# Patient Record
Sex: Female | Born: 1960 | Race: White | Hispanic: No | Marital: Married | Smoking: Never smoker
Health system: Southern US, Community
[De-identification: ages and names within clinical notes are randomized; demographics above are authoritative.]

## PROBLEM LIST (undated history)

## (undated) DIAGNOSIS — D6861 Antiphospholipid syndrome: Secondary | ICD-10-CM

## (undated) DIAGNOSIS — G459 Transient cerebral ischemic attack, unspecified: Secondary | ICD-10-CM

## (undated) DIAGNOSIS — M1990 Unspecified osteoarthritis, unspecified site: Secondary | ICD-10-CM

## (undated) DIAGNOSIS — J45909 Unspecified asthma, uncomplicated: Secondary | ICD-10-CM

## (undated) DIAGNOSIS — E7800 Pure hypercholesterolemia, unspecified: Secondary | ICD-10-CM

## (undated) DIAGNOSIS — M5030 Other cervical disc degeneration, unspecified cervical region: Secondary | ICD-10-CM

## (undated) DIAGNOSIS — I10 Essential (primary) hypertension: Secondary | ICD-10-CM

## (undated) HISTORY — PX: TONSILLECTOMY: SHX5217

## (undated) HISTORY — DX: Antiphospholipid syndrome: D68.61

## (undated) HISTORY — DX: Transient cerebral ischemic attack, unspecified: G45.9

## (undated) HISTORY — PX: ABLATION: SHX5711

## (undated) HISTORY — PX: EYE SURGERY: SHX253

## (undated) HISTORY — PX: TUBAL LIGATION: SHX77

## (undated) HISTORY — DX: Other cervical disc degeneration, unspecified cervical region: M50.30

## (undated) HISTORY — DX: Unspecified asthma, uncomplicated: J45.909

## (undated) HISTORY — PX: GALLBLADDER SURGERY: SHX652

## (undated) HISTORY — PX: CATARACT EXTRACTION: SUR2

## (undated) HISTORY — DX: Unspecified osteoarthritis, unspecified site: M19.90

---

## 1998-10-14 ENCOUNTER — Inpatient Hospital Stay (HOSPITAL_COMMUNITY): Admission: AD | Admit: 1998-10-14 | Discharge: 1998-10-17 | Payer: Self-pay | Admitting: Family Medicine

## 2001-12-09 ENCOUNTER — Other Ambulatory Visit: Admission: RE | Admit: 2001-12-09 | Discharge: 2001-12-09 | Payer: Self-pay | Admitting: Obstetrics and Gynecology

## 2003-02-19 ENCOUNTER — Other Ambulatory Visit: Admission: RE | Admit: 2003-02-19 | Discharge: 2003-02-19 | Payer: Self-pay | Admitting: Obstetrics and Gynecology

## 2004-03-25 ENCOUNTER — Other Ambulatory Visit: Admission: RE | Admit: 2004-03-25 | Discharge: 2004-03-25 | Payer: Self-pay | Admitting: Obstetrics and Gynecology

## 2005-08-24 ENCOUNTER — Other Ambulatory Visit: Admission: RE | Admit: 2005-08-24 | Discharge: 2005-08-24 | Payer: Self-pay | Admitting: Obstetrics and Gynecology

## 2006-05-11 ENCOUNTER — Encounter (INDEPENDENT_AMBULATORY_CARE_PROVIDER_SITE_OTHER): Payer: Self-pay | Admitting: Specialist

## 2006-05-11 ENCOUNTER — Ambulatory Visit (HOSPITAL_COMMUNITY): Admission: RE | Admit: 2006-05-11 | Discharge: 2006-05-11 | Payer: Self-pay | Admitting: Obstetrics and Gynecology

## 2009-10-15 ENCOUNTER — Encounter: Payer: Self-pay | Admitting: Internal Medicine

## 2009-11-02 ENCOUNTER — Ambulatory Visit: Payer: Self-pay | Admitting: Internal Medicine

## 2009-11-02 DIAGNOSIS — J40 Bronchitis, not specified as acute or chronic: Secondary | ICD-10-CM | POA: Insufficient documentation

## 2011-04-28 NOTE — Op Note (Signed)
NAME:  Tina Lester, LATKA NO.:  [id number]   MEDICAL RECORD NO.:  [id number]          PATIENT TYPE:  AMB   LOCATION:  SDC                           FACILITY:  [facility]   PHYSICIAN:  Miguel Aschoff, M.D.       DATE OF BIRTH:  30-Jul-1961   DATE OF PROCEDURE:  05/11/2006  DATE OF DISCHARGE:                                 OPERATIVE REPORT   PREOPERATIVE DIAGNOSIS:  Menometrorrhagia.   POSTOPERATIVE DIAGNOSIS:  Menometrorrhagia.   PROCEDURE:  D&C hysteroscopy, Novasure endometrial ablation.   SURGEON:  Dr. Miguel Aschoff.   ANESTHESIA:  General.   COMPLICATIONS:  None.   JUSTIFICATION:  The patient is a 50 year old white female with history of  very heavy and irregular menstrual cycles.  The patient was evaluated in the  office and offered various options including Jearld Adjutant IUD, cyclic progesterone  therapy, birth control pills or D&C and endometrial ablation.  After  undergoing discussion of the risks and benefits of each method, the patient  is elected to undergo D&C hysteroscopy and endometrial ablation.  Risks and  benefits of procedure were discussed with the patient.   PROCEDURE:  The patient is taken to the operating placed in the supine  position.  General anesthesia was administered without difficulty.  She was  then placed in dorsolithotomy position and prepped and draped in the usual  sterile fashion.  Examination was carried out which revealed normal external  genitalia, normal Bartholin and Skene's glands, normal urethra.  The vaginal  vault was without gross lesion.  The cervix was without gross lesion.  Uterus noted be normal size, mid position.  No adnexal masses were palpated.  Speculum was then placed in the vaginal vault.  Anterior cervical lip was  grasped with tenaculum and endocervical canal was sounded to 4 cm.  The  endometrial cavity was sounded to 10 cm for cavity length of 6 cm.  After  this was done, the endometrial cavity was reached by dilating the  cervix  using serial Pratt dilators until a #25 Pratt dilator could be passed.  The  diagnostic hysteroscope was advanced through the endocervix.  No  endocervical lesions were noted.  The endometrial cavity was unremarkable.  No polyps or submucous myomas were noted.  At this point, vigorous sharp  curettage was carried out.  The tissue removed was sent for histologic  study.  At this point the Novasure ablation unit was then placed into the  uterine cavity. A cavity width of 4.2 cm was found and then with a power  setting of 139 watts, a 59-second treatment cycle was carried out without  difficulty.  At this point all instruments were removed.  The Novasure unit  was removed intact.  The paracervical block was then placed for  postoperative pain relief and procedure was completed.  The estimated blood  loss was less than 30 mL.   Plan is for the patient be discharged home.  Medications for home include  doxycycline 100 mg twice a day x3 days, Darvocet N 100 every 4 hours as  needed for pain.  She will be seen back in  four weeks for follow-up  examination.  She is to call for any problems such as fever, pain or heavy  bleeding.  She is instructed to place nothing in the vagina for two weeks.      Miguel Aschoff, M.D.  Electronically Signed     AR/MEDQ  D:  05/11/2006  T:  05/11/2006  Job:  [id number]

## 2013-03-12 ENCOUNTER — Other Ambulatory Visit: Payer: Self-pay | Admitting: Obstetrics and Gynecology

## 2014-03-25 ENCOUNTER — Other Ambulatory Visit: Payer: Self-pay | Admitting: Obstetrics and Gynecology

## 2014-12-11 DIAGNOSIS — G459 Transient cerebral ischemic attack, unspecified: Secondary | ICD-10-CM

## 2014-12-11 HISTORY — DX: Transient cerebral ischemic attack, unspecified: G45.9

## 2015-09-29 ENCOUNTER — Ambulatory Visit (INDEPENDENT_AMBULATORY_CARE_PROVIDER_SITE_OTHER): Payer: 59 | Admitting: Neurology

## 2015-09-29 ENCOUNTER — Encounter: Payer: Self-pay | Admitting: Neurology

## 2015-09-29 VITALS — BP 134/94 | HR 82 | Ht 62.0 in | Wt 198.0 lb

## 2015-09-29 DIAGNOSIS — R200 Anesthesia of skin: Secondary | ICD-10-CM | POA: Diagnosis not present

## 2015-09-29 DIAGNOSIS — R930 Abnormal findings on diagnostic imaging of skull and head, not elsewhere classified: Secondary | ICD-10-CM

## 2015-09-29 DIAGNOSIS — R9089 Other abnormal findings on diagnostic imaging of central nervous system: Secondary | ICD-10-CM

## 2015-09-29 NOTE — Progress Notes (Signed)
PATIENT: Tina Lester DOB: 01/26/1961  Chief Complaint  Patient presents with  . Transient Ischemic Attack    Reports having multiple TIA events in the past. Her most recent event caused her to have left-sided numbness, weakness and dizziness.  She was admitted to [facility] for two days.  Says all her test were negative for stroke with the exception of an abnormal MRI.  She still has slight residual left-sided weakness and was discharged from PT today with instructions to continue home exercises.     HISTORICAL  Tina Lester, is a 54 years old right-handed female, seen in refer by her primary care physician Dr. Philemon Kingdomaroline Prochnau, MD in September 29 2015, for evidence of TIA like episode.  In September 16 2015, while at sleep, she woke up around 3 clock in the morning, felt something moving to her body, her whole left side was numb, including left face, arm and leg, mild slurred speech, she also noticed mild left-sided weakness, numbness last about one hour, weakness last about 5 days.  She presented to [facility] emergency room, had extensive evaluation, I have personally reviewed CAT scan of the brain, mild atrophy small vessel disease, no acute lesions. I have personally reviewed MRI of the brain with without contrast in September 16 2015, there was no acute stroke, mild atrophy and small vessel disease.  Laboratory showed normal CMP, CBC, CPK was 115, normal liver functional test. cholesterol panel cholesterol was 221, triglyceride 162, LDL 137, TSH was normal 1.35 normal INR 1.0 negative RPR, negative ANA,  EKG was normal. Ultrasound of carotid artery showed no significant large vessel disease.  She was started on baby aspirin daily. She denies neck pain, no bowel and bladder incontinence, no gait difficulty.  REVIEW OF SYSTEMS: Full 14 system review of systems performed and notable only for as above  ALLERGIES: No Known Allergies  HOME MEDICATIONS: Current  Outpatient Prescriptions  Medication Sig Dispense Refill  . ALBUTEROL IN Inhale into the lungs as needed.    Marland Kitchen. aspirin 81 MG tablet Take 81 mg by mouth daily.    Marland Kitchen. esomeprazole (NEXIUM) 20 MG capsule Take 20 mg by mouth as needed.    . fluticasone (FLONASE) 50 MCG/ACT nasal spray Place into both nostrils daily.    . hydrochlorothiazide (MICROZIDE) 12.5 MG capsule Take 12.5 mg by mouth daily.  6  . montelukast (SINGULAIR) 10 MG tablet Take 10 mg by mouth at bedtime.     No current facility-administered medications for this visit.    PAST MEDICAL HISTORY: Past Medical History  Diagnosis Date  . Asthma   . Degenerative joint disease   . Degenerative disc disease, cervical   . TIA (transient ischemic attack)     PAST SURGICAL HISTORY: Past Surgical History  Procedure Laterality Date  . Gallbladder surgery    . Tonsillectomy    . Tubal ligation    . Ablation      FAMILY HISTORY: Family History  Problem Relation Age of Onset  . Diabetes Mother   . Stroke Father   . Alcoholism Father   . Hypertension Mother   . Dementia Father     SOCIAL HISTORY:  Social History   Social History  . Marital Status: Married    Spouse Name: N/A  . Number of Children: 2  . Years of Education: 14   Occupational History  . Management    Social History Main Topics  . Smoking status: Never Smoker   .  Smokeless tobacco: Not on file  . Alcohol Use: 0.0 oz/week    0 Standard drinks or equivalent per week     Comment: 1-2 drinks per month  . Drug Use: No  . Sexual Activity: Not on file   Other Topics Concern  . Not on file   Social History Narrative   Lives at home with husband and son.   1-2 cups caffeine per day.   Right-handed.        PHYSICAL EXAM   Filed Vitals:   09/29/15 1337  BP: 134/94  Pulse: 82  Height:  (1.575 m)  Weight: 198 lb (89.812 kg)    Not recorded      Body mass index is 36.21 kg/(m^2).  PHYSICAL EXAMNIATION:  Gen: NAD, conversant, well  nourised, obese, well groomed                     Cardiovascular: Regular rate rhythm, no peripheral edema, warm, nontender. Eyes: Conjunctivae clear without exudates or hemorrhage Neck: Supple, no carotid bruise. Pulmonary: Clear to auscultation bilaterally   NEUROLOGICAL EXAM:  MENTAL STATUS: Speech:    Speech is normal; fluent and spontaneous with normal comprehension.  Cognition:     Orientation to time, place and person     Normal recent and remote memory     Normal Attention span and concentration     Normal Language, naming, repeating,spontaneous speech     Fund of knowledge   CRANIAL NERVES: CN II: Visual fields are full to confrontation. Fundoscopic exam is normal with sharp discs and no vascular changes. Pupils are round equal and briskly reactive to light. CN III, IV, VI: extraocular movement are normal. No ptosis. CN V: Facial sensation is intact to pinprick in all 3 divisions bilaterally. Corneal responses are intact.  CN VII: Face is symmetric with normal eye closure and smile. CN VIII: Hearing is normal to rubbing fingers CN IX, X: Palate elevates symmetrically. Phonation is normal. CN XI: Head turning and shoulder shrug are intact CN XII: Tongue is midline with normal movements and no atrophy.  MOTOR: There is no pronator drift of out-stretched arms. Muscle bulk and tone are normal. Muscle strength is normal.  REFLEXES: Reflexes are 2+ and symmetric at the biceps, triceps, knees, and ankles. Plantar responses are flexor.  SENSORY: Present to light touch pinprick vibratory sensation, but altered on the left arm and left leg  COORDINATION: Rapid alternating movements and fine finger movements are intact. There is no dysmetria on finger-to-nose and heel-knee-shin.    GAIT/STANCE: Posture is normal. Gait is steady with normal steps, base, arm swing, and turning. Heel and toe walking are normal. Tandem gait is normal.  Romberg is absent.   DIAGNOSTIC DATA  (LABS, IMAGING, TESTING) - I reviewed patient records, labs, notes, testing and imaging myself where available.   ASSESSMENT AND PLAN  Tina Lester is a 54 y.o. female   Presented with persistent left side numbness paresthesia, brisk reflexes on examination Potential localization to cervical region MRI of cervical Continue daily aspirin  Levert Feinstein, M.D. Ph.D.  [facility] [address], [address] [place], [place] [address] Ph: [phone] Fax: [phone]  CC: Philemon Kingdom, MD

## 2015-09-30 ENCOUNTER — Telehealth: Payer: Self-pay | Admitting: Neurology

## 2015-09-30 ENCOUNTER — Other Ambulatory Visit: Payer: Self-pay | Admitting: *Deleted

## 2015-09-30 DIAGNOSIS — F40240 Claustrophobia: Secondary | ICD-10-CM

## 2015-09-30 MED ORDER — ALPRAZOLAM 1 MG PO TABS: ORAL_TABLET | ORAL | Status: DC

## 2015-09-30 NOTE — Telephone Encounter (Signed)
Patient returned call about where to schedule MRI. Patient would prefer to do this at [facility], second choice-[facility]. Patient does state that she is claustrophobic. Please call cell# [phone].

## 2015-09-30 NOTE — Telephone Encounter (Signed)
Ok per vo by Dr. Terrace ArabiaYan, to send in Xanax per MRI protocol to pharmacy.

## 2015-10-01 NOTE — Telephone Encounter (Signed)
Patient is scheduled @ [facility] @ 7AM on 10/06/15. Patient is aware of appointment date and time. Thanks!

## 2015-10-08 ENCOUNTER — Telehealth: Payer: Self-pay | Admitting: Neurology

## 2015-10-08 NOTE — Telephone Encounter (Signed)
Pt called sts she saw MRI results on mychart, she doesn't see Dr Terrace ArabiaYan until 11/09/15. Pt is inquiring what is next step. She can be reached next week at [phone]

## 2015-10-08 NOTE — Telephone Encounter (Addendum)
I tried, could not access her MRI result. Would you please call [facility] for MRI result

## 2015-10-08 NOTE — Telephone Encounter (Signed)
Patient had MRI at [facility].

## 2015-10-13 NOTE — Telephone Encounter (Signed)
Results faxed for review.  Disc is being sent overnight to our office.

## 2015-10-14 NOTE — Telephone Encounter (Signed)
I have reviewed the report, called patient MRI of cervical spine showed multilevel  facet arthropathy, with lower cervical disc degeneration, facet arthritis is focally severe on the right at C5-6, cause advanced right foraminal stenosis. C6 and 7, advanced left foraminal stenosis, moderate canal stenosis, with early cord mass effect. C3-4, moderate right foraminal stenosis.  Patient reported she has a known history of cervical disc problem, she denies gait difficulty, no bowel and bladder incontinence, no significant neck pain, no radiating pain to upper extremity.  I have advised patient to bring MRI disc at her follow-up visit, I have offered to see her in November 4th 2016, she decided to come in early next week,  Tina JesterMichele, please give her a follow-up appointment early next week

## 2015-10-15 ENCOUNTER — Other Ambulatory Visit: Payer: Self-pay

## 2015-10-15 ENCOUNTER — Ambulatory Visit: Payer: Self-pay

## 2015-10-15 ENCOUNTER — Ambulatory Visit: Payer: Self-pay | Admitting: Hematology & Oncology

## 2015-10-15 NOTE — Telephone Encounter (Signed)
LVM for pt to call office to schedule f/u appt per Dr Terrace ArabiaYan request. Andee LinemanGave [facility] phone number and hours.

## 2015-10-18 NOTE — Telephone Encounter (Signed)
R/s pt appt from 11/29 to 11/10 with Dr Terrace ArabiaYan. Pt to bring scans

## 2015-10-21 ENCOUNTER — Encounter: Payer: Self-pay | Admitting: Neurology

## 2015-10-21 ENCOUNTER — Ambulatory Visit (INDEPENDENT_AMBULATORY_CARE_PROVIDER_SITE_OTHER): Payer: 59 | Admitting: Neurology

## 2015-10-21 VITALS — BP 137/98 | HR 85 | Ht 62.0 in | Wt 196.0 lb

## 2015-10-21 DIAGNOSIS — M4802 Spinal stenosis, cervical region: Secondary | ICD-10-CM | POA: Diagnosis not present

## 2015-10-21 DIAGNOSIS — G459 Transient cerebral ischemic attack, unspecified: Secondary | ICD-10-CM | POA: Insufficient documentation

## 2015-10-21 NOTE — Progress Notes (Signed)
PATIENT: Tina Lester DOB: 1961/11/22  Chief Complaint  Patient presents with  . Left sided numbness    She is here with her husband, Gerlene Burdock, to review her tests results.      HISTORICAL  Tina Lester, is a 54 years old right-handed female, seen in refer by her primary care physician Dr. Philemon Kingdom, MD in September 29 2015, for evidence of TIA like episode.  In September 16 2015, while at sleep, she woke up around 3 clock in the morning, felt something moving to her body, her whole left side was numb, including left face, arm and leg, mild slurred speech, she also noticed mild left-sided weakness, numbness last about one hour, weakness last about 5 days.  She presented to [facility] emergency room, had extensive evaluation, I have personally reviewed CAT scan of the brain, mild atrophy small vessel disease, no acute lesions. I have personally reviewed MRI of the brain with without contrast in September 16 2015, there was no acute stroke, mild atrophy and small vessel disease.  Laboratory showed normal CMP, CBC, CPK was 115, normal liver functional test. cholesterol panel cholesterol was 221, triglyceride 162, LDL 137, TSH was normal 1.35 normal INR 1.0 negative RPR, negative ANA,  EKG was normal. Ultrasound of carotid artery showed no significant large vessel disease.  She was started on baby aspirin daily. She denies neck pain, no bowel and bladder incontinence, no gait difficulty.  UPDATE Oct 21 2015: She complains of neck stiffness, she was told she need cervical decompression surgery about 10 years ago, over the years, her neck pain stiffness has been managed by massage, physical therapy, she denies significant gait difficulty, no bowel and bladder incontinence, no radiating pain from cervical to upper extremity.  She is also under the care of local hematologist for potential antiphospholipid syndrome.  We have reviewed MRI of the cervical film in October 2016  done at [facility]: showed multilevel  facet arthropathy, with lower cervical disc degeneration, facet arthritis is focally severe on the right at C5-6, cause advanced right foraminal stenosis. C6 and 7, advanced left foraminal stenosis, moderate canal stenosis, with early cord mass effect. C3-4, moderate right foraminal stenosis.  REVIEW OF SYSTEMS: Full 14 system review of systems performed and notable only for as above  ALLERGIES: No Known Allergies  HOME MEDICATIONS: Current Outpatient Prescriptions  Medication Sig Dispense Refill  . ALBUTEROL IN Inhale into the lungs as needed.    Marland Kitchen aspirin 81 MG tablet Take 81 mg by mouth daily.    . fluticasone (FLONASE) 50 MCG/ACT nasal spray Place into both nostrils daily.    . hydrochlorothiazide (MICROZIDE) 12.5 MG capsule Take 12.5 mg by mouth daily.  6  . montelukast (SINGULAIR) 10 MG tablet Take 10 mg by mouth at bedtime.     No current facility-administered medications for this visit.    PAST MEDICAL HISTORY: Past Medical History  Diagnosis Date  . Asthma   . Degenerative joint disease   . Degenerative disc disease, cervical   . TIA (transient ischemic attack)     PAST SURGICAL HISTORY: Past Surgical History  Procedure Laterality Date  . Gallbladder surgery    . Tonsillectomy    . Tubal ligation    . Ablation      FAMILY HISTORY: Family History  Problem Relation Age of Onset  . Diabetes Mother   . Stroke Father   . Alcoholism Father   . Hypertension Mother   . Dementia Father  SOCIAL HISTORY:  Social History   Social History  . Marital Status: Married    Spouse Name: N/A  . Number of Children: 2  . Years of Education: 14   Occupational History  . Management    Social History Main Topics  . Smoking status: Never Smoker   . Smokeless tobacco: Not on file  . Alcohol Use: 0.0 oz/week    0 Standard drinks or equivalent per week     Comment: 1-2 drinks per month  . Drug Use: No  . Sexual Activity:  Not on file   Other Topics Concern  . Not on file   Social History Narrative   Lives at home with husband and son.   1-2 cups caffeine per day.   Right-handed.        PHYSICAL EXAM   Filed Vitals:   10/21/15 0759  BP: 137/98  Pulse: 85  Height:  (1.575 m)  Weight: 196 lb (88.905 kg)    Not recorded      Body mass index is 35.84 kg/(m^2).  PHYSICAL EXAMNIATION:  Gen: NAD, conversant, well nourised, obese, well groomed                     Cardiovascular: Regular rate rhythm, no peripheral edema, warm, nontender. Eyes: Conjunctivae clear without exudates or hemorrhage Neck: Supple, no carotid bruise. Pulmonary: Clear to auscultation bilaterally   NEUROLOGICAL EXAM:  MENTAL STATUS: Speech:    Speech is normal; fluent and spontaneous with normal comprehension.  Cognition:     Orientation to time, place and person     Normal recent and remote memory     Normal Attention span and concentration     Normal Language, naming, repeating,spontaneous speech     Fund of knowledge   CRANIAL NERVES: CN II: Visual fields are full to confrontation. Fundoscopic exam is normal with sharp discs and no vascular changes. Pupils are round equal and briskly reactive to light. CN III, IV, VI: extraocular movement are normal. No ptosis. CN V: Facial sensation is intact to pinprick in all 3 divisions bilaterally. Corneal responses are intact.  CN VII: Face is symmetric with normal eye closure and smile. CN VIII: Hearing is normal to rubbing fingers CN IX, X: Palate elevates symmetrically. Phonation is normal. CN XI: Head turning and shoulder shrug are intact CN XII: Tongue is midline with normal movements and no atrophy.  MOTOR: There is no pronator drift of out-stretched arms. Muscle bulk and tone are normal. Muscle strength is normal.  REFLEXES: Reflexes are 2+ and symmetric at the biceps, triceps, knees, and ankles. Plantar responses are flexor.  SENSORY: Present to light  touch pinprick vibratory sensation, but altered on the left arm and left leg  COORDINATION: Rapid alternating movements and fine finger movements are intact. There is no dysmetria on finger-to-nose and heel-knee-shin.    GAIT/STANCE: Posture is normal. Gait is steady with normal steps, base, arm swing, and turning. Heel and toe walking are normal. Tandem gait is normal.  Romberg is absent.   DIAGNOSTIC DATA (LABS, IMAGING, TESTING) - I reviewed patient records, labs, notes, testing and imaging myself where available.   ASSESSMENT AND PLAN  Tina Lester is a 54 y.o. female   Presented with persistent left side numbness paresthesia, brisk reflexes on examination Cervical stenosis  Not a surgical candidate based on her history, fairly normal neurological examination  I have advised her continue observe her symptoms, call clinic for worsening gait difficulty,  urinary bowel bladder incontinence, cervical radiculopathy symptoms  Transient left-sided paresthesia possible TIA,  Keep aspirin daily   Levert FeinsteinYijun Temeca Somma, M.D. Ph.D.  [facility] [address], [address] [place], [place] [address] Ph: [phone] Fax: [phone]  CC: Philemon Kingdomaroline Prochnau, MD

## 2015-11-01 ENCOUNTER — Ambulatory Visit: Payer: Self-pay

## 2015-11-01 ENCOUNTER — Ambulatory Visit (HOSPITAL_BASED_OUTPATIENT_CLINIC_OR_DEPARTMENT_OTHER): Payer: Self-pay | Admitting: Hematology & Oncology

## 2015-11-01 ENCOUNTER — Encounter: Payer: Self-pay | Admitting: Hematology & Oncology

## 2015-11-01 ENCOUNTER — Ambulatory Visit (HOSPITAL_BASED_OUTPATIENT_CLINIC_OR_DEPARTMENT_OTHER): Payer: Self-pay

## 2015-11-01 VITALS — BP 122/86 | HR 93 | Temp 98.1°F | Resp 16 | Ht 62.0 in | Wt 198.0 lb

## 2015-11-01 DIAGNOSIS — D6861 Antiphospholipid syndrome: Secondary | ICD-10-CM | POA: Insufficient documentation

## 2015-11-01 DIAGNOSIS — G459 Transient cerebral ischemic attack, unspecified: Secondary | ICD-10-CM

## 2015-11-01 HISTORY — DX: Antiphospholipid syndrome: D68.61

## 2015-11-01 LAB — CBC WITH DIFFERENTIAL (CANCER CENTER ONLY)
BASO#: 0 10*3/uL (ref 0.0–0.2)
BASO%: 0.6 % (ref 0.0–2.0)
EOS ABS: 0.2 10*3/uL (ref 0.0–0.5)
EOS%: 3.5 % (ref 0.0–7.0)
HEMATOCRIT: 41 % (ref 34.8–46.6)
HGB: 14.4 g/dL (ref 11.6–15.9)
LYMPH#: 1.8 10*3/uL (ref 0.9–3.3)
LYMPH%: 29.7 % (ref 14.0–48.0)
MCH: 30.4 pg (ref 26.0–34.0)
MCHC: 35.1 g/dL (ref 32.0–36.0)
MCV: 87 fL (ref 81–101)
MONO#: 0.5 10*3/uL (ref 0.1–0.9)
MONO%: 7.3 % (ref 0.0–13.0)
NEUT#: 3.7 10*3/uL (ref 1.5–6.5)
NEUT%: 58.9 % (ref 39.6–80.0)
Platelets: 238 10*3/uL (ref 145–400)
RBC: 4.74 10*6/uL (ref 3.70–5.32)
RDW: 12.4 % (ref 11.1–15.7)
WBC: 6.2 10*3/uL (ref 3.9–10.0)

## 2015-11-01 NOTE — Progress Notes (Addendum)
Referral MD  Reason for Referral: IgM anti-cardiolipin Ab syndrome w/ TIA  Chief Complaint  Patient presents with  . Follow-up  : I had a TIA in October  HPI: Tina Lester is a very nice 54 year old white female. She has been in pretty good health. She has had a cholecystectomy.  She works for a [employer].  Back in October, she had a TIA. She said that the right side of her body got numb. She had no weakness. She had no problems talking. She had no dizziness. This all seemed to resolve spontaneously. I'll be she's been on any heparin or any formal anticoagulation.  She did have an MRI of the brain. There is no acute stroke or other intracranial abnormalities noted.   She had carotid Dopplers which were normal.   She was treated down at [facility]. She was ultimately found to have an elevated anticardiolipin antibody. She had an IgM anticardiolipin antibody of 46. She had a IgM beta-2 glycoprotein > 150.  She was put on baby aspirin.  Because of this abnormal coagulation studies, she was referred to the [facility] for further recommendations.  She thinks that her dad had a long history of any strokes. She said that he was a big smoker. She said that he was in the hospital quite a bit.  He has 2 healthy children. She did have a miscarriage in between her 2 healthy pregnancies. Impression no problems with the 2 pregnancies. She had no early deliveries. She had no low birth weight babies.  She's not noted any issues with rashes.  She has had no change in bowel bladder habits. She does have her mammograms on a routine basis.  She has not noted any issues with weight loss or weight gain. She is trying to exercise.  She does not smoke. She does not have diabetes. She's not been using any kind of menopausal estrogens.  She said that before she had a TIA, she had a gastroneuritis in which she died severely dehydrated because of nausea, vomiting and  diarrhea.  Currently, her overall performance status is ECOG 0.     Past Medical History  Diagnosis Date  . Asthma   . Degenerative joint disease   . Degenerative disc disease, cervical   . TIA (transient ischemic attack)   :  Past Surgical History  Procedure Laterality Date  . Gallbladder surgery    . Tonsillectomy    . Tubal ligation    . Ablation    :   Current outpatient prescriptions:  .  ALBUTEROL IN, Inhale into the lungs as needed., Disp: , Rfl:  .  aspirin 81 MG tablet, Take 81 mg by mouth daily., Disp: , Rfl:  .  fluticasone (FLONASE) 50 MCG/ACT nasal spray, Place into both nostrils daily., Disp: , Rfl:  .  hydrochlorothiazide (MICROZIDE) 12.5 MG capsule, Take 12.5 mg by mouth daily., Disp: , Rfl: 6 .  montelukast (SINGULAIR) 10 MG tablet, Take 10 mg by mouth at bedtime., Disp: , Rfl: :  :  No Known Allergies:  Family History  Problem Relation Age of Onset  . Diabetes Mother   . Stroke Father   . Alcoholism Father   . Hypertension Mother   . Dementia Father   :  Social History   Social History  . Marital Status: Married    Spouse Name: N/A  . Number of Children: 2  . Years of Education: 14   Occupational History  . Management  Social History Main Topics  . Smoking status: Never Smoker   . Smokeless tobacco: Not on file  . Alcohol Use: 0.0 oz/week    0 Standard drinks or equivalent per week     Comment: 1-2 drinks per month  . Drug Use: No  . Sexual Activity: Not on file   Other Topics Concern  . Not on file   Social History Narrative   Lives at home with husband and son.   1-2 cups caffeine per day.   Right-handed.     :  Pertinent items are noted in HPI.  Exam: @IPVITALS @  moderately obese white female in no obvious distress. Her vital signs are temperature of 98.1. Pulse 93. Blood pressure 122/86. Weight is 198 pounds. Head and neck exam shows no ocular or oral lesions. There are no palpable cervical or supraclavicular lymph  nodes. Her lungs are clear bilaterally. No wheezes are noted. Cardiac exam regular rate and rhythm with no murmurs, rubs or bruits. Abdomen is soft. She has good bowel sounds. There is no fluid wave. There is no palpable liver or spleen tip. Back exam shows no tenderness over the spine, ribs or hips. Extremities shows no clubbing, cyanosis or edema. Neurological exam shows no focal neurological deficits. Skin exam shows no rashes, ecchymoses or petechia.    Recent Labs  11/01/15 1500  WBC 6.2  HGB 14.4  HCT 41.0  PLT 238   No results for input(s): NA, K, CL, CO2, GLUCOSE, BUN, CREATININE, CALCIUM in the last 72 hours.  Blood smear review:  None  Pathology: None     Assessment and Plan:  Tina Lester is a very charming 54 year old white female. She has an elevated IgM anticardiolipin antibody. She is a markedly elevated beta-2 glycoprotein IgM.  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 I suspect that this TIA is related to her having the anticardiolipin antibody.  I believe that she Lester need full dose aspirin. I think that she is at risk for other thromboembolic events. I think for those aspirin would give her more protection.  She has a great job in minimizing exogenous influences, such as smoking, diabetes, estrogens, etc.  I do not see that we need to do any scans on her. I don't see any evidence of any underlying malignancy.  For now, I do not think we have to get her back to the office. I'm not sure that we really are adding to her medical care. Again, the full dose aspirin I think would be reasonable.  I spent about 45 minutes with her. She is very very nice.  Hewitt ShortsPete E

## 2015-11-02 ENCOUNTER — Encounter: Payer: Self-pay | Admitting: Neurology

## 2015-11-08 LAB — CARDIOLIPIN ANTIBODIES, IGG, IGM, IGA
Anticardiolipin IgA: <11 [APL'U]
Anticardiolipin IgM: 25 [MPL'U] — ABNORMAL HIGH

## 2015-11-08 LAB — RFX PTT-LA W/RFX TO HEX PHASE CONF: PTT-LA SCREEN: 39 s (ref <=40)

## 2015-11-08 LAB — RFX DRVVT SCR W/RFLX CONF 1:1 MIX: dRVVT Screen: 40 s (ref <=45)

## 2015-11-08 LAB — LUPUS ANTICOAGULANT PANEL

## 2015-11-09 ENCOUNTER — Ambulatory Visit: Payer: 59 | Admitting: Neurology

## 2015-11-17 ENCOUNTER — Ambulatory Visit: Payer: Self-pay

## 2015-11-17 ENCOUNTER — Ambulatory Visit: Payer: Self-pay | Admitting: Hematology & Oncology

## 2015-11-17 ENCOUNTER — Other Ambulatory Visit: Payer: Self-pay

## 2016-02-16 ENCOUNTER — Ambulatory Visit (INDEPENDENT_AMBULATORY_CARE_PROVIDER_SITE_OTHER): Payer: 59 | Admitting: Allergy and Immunology

## 2016-02-16 ENCOUNTER — Encounter: Payer: Self-pay | Admitting: Allergy and Immunology

## 2016-02-16 VITALS — BP 144/90 | HR 64 | Temp 98.4°F | Resp 16 | Ht 61.81 in | Wt 202.6 lb

## 2016-02-16 DIAGNOSIS — J4520 Mild intermittent asthma, uncomplicated: Secondary | ICD-10-CM

## 2016-02-16 DIAGNOSIS — K219 Gastro-esophageal reflux disease without esophagitis: Secondary | ICD-10-CM

## 2016-02-16 DIAGNOSIS — J309 Allergic rhinitis, unspecified: Secondary | ICD-10-CM

## 2016-02-16 DIAGNOSIS — H1010 Acute atopic conjunctivitis, unspecified eye: Secondary | ICD-10-CM

## 2016-02-16 DIAGNOSIS — J387 Other diseases of larynx: Secondary | ICD-10-CM

## 2016-02-16 MED ORDER — FLUTICASONE PROPIONATE 50 MCG/ACT NA SUSP: NASAL | Status: DC

## 2016-02-16 MED ORDER — RANITIDINE HCL 300 MG PO TABS: 300.0000 mg | ORAL_TABLET | Freq: Every day | ORAL | Status: DC

## 2016-02-16 MED ORDER — OMEPRAZOLE 20 MG PO CPDR: 20.0000 mg | DELAYED_RELEASE_CAPSULE | Freq: Every day | ORAL | Status: DC

## 2016-02-16 MED ORDER — ALBUTEROL SULFATE HFA 108 (90 BASE) MCG/ACT IN AERS: 2.0000 | INHALATION_SPRAY | Freq: Four times a day (QID) | RESPIRATORY_TRACT | Status: DC | PRN

## 2016-02-16 NOTE — Progress Notes (Signed)
Dear Dr. Sudie Bailey,  Thank you for referring Tina Lester to the [facility] [place] on 02/16/2016.   Below is a summation of this patient's evaluation and recommendations.  Thank you for your referral. I will keep you informed about this patient's response to treatment.   If you have any questions please to do hestitate to contact me.   Sincerely,  Jessica Priest, MD  [facility]   ______________________________________________________________________    NEW PATIENT NOTE  Referring Provider: Philemon Kingdom, MD Primary Provider: Philemon Kingdom, MD Date of office visit: 02/16/2016    Subjective:   Chief Complaint:  Tina Lester (DOB: Jan 15, 1961) is a 55 y.o. female with a chief complaint of New Patient (Initial Visit)  who presents to the clinic on 02/16/2016 with the following problems:  HPI Comments: Tina Lester presents to this clinic in evaluation of persistent upper airway symptoms. I've seen Tina Lester in this clinic approximately 15 years ago at which time she appeared to have allergic rhinoconjunctivitis and some intermittent asthma that was well handled with Flonase and montelukast utilized on a consistent basis. Unfortunately, since January 2017 her situation has changed somewhat. At that point she apparently contracted a significant episode of "sinusitis" manifested as very severe nasal congestion and sneezing and clear rhinorrhea and full years and coughing. She was treated with azithromycin and most recently has been treated with a course of prednisone for persistent issues with cough and postnasal drip and sneezing and pressure on her ears and head fullness. She does not have any anosmia, ugly nasal discharge, headaches, but she does have a fair amount of popping in her ears.  In the past she has been given therapy for reflux because of persistent issues with her throat and  cough believed to be LPR. As well, she had a prolonged episode of cough back in 2010 for which she saw Dr. Fannie Knee and this appeared to be some type of infectious disease but she contracted while on a plane flight. Fortunately this has completely resolved.   Past Medical History  Diagnosis Date  . Asthma   . Degenerative joint disease   . Degenerative disc disease, cervical   . TIA (transient ischemic attack)   . 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 Gallbladder surgery    . Tonsillectomy    . Tubal ligation    . Ablation     Current outpatient prescriptions:  .  ALBUTEROL IN, Inhale into the lungs as needed., Disp: , Rfl:  .  aspirin 325 MG tablet, Take 325 mg by mouth daily., Disp: , Rfl:  .  fluticasone (FLONASE) 50 MCG/ACT nasal spray, Place into both nostrils daily., Disp: , Rfl:  .  hydrochlorothiazide (MICROZIDE) 12.5 MG capsule, Take 12.5 mg by mouth daily., Disp: , Rfl: 6 .  levalbuterol (XOPENEX HFA) 45 MCG/ACT inhaler, Inhale 2 puffs into the lungs every 4 (four) hours as needed for wheezing., Disp: , Rfl:  .  levalbuterol (XOPENEX) 1.25 MG/0.5ML nebulizer solution, Take 1.25 mg by nebulization every 4 (four) hours as needed for wheezing or shortness of breath., Disp: , Rfl:  .  montelukast (SINGULAIR) 10 MG tablet, Take 10 mg by mouth at bedtime., Disp: , Rfl:  .  aspirin 81 MG tablet, Take 81 mg by mouth daily. Reported on 02/16/2016, Disp: , Rfl:    No Known Allergies  Review  of systems negative except as noted in HPI / PMHx or noted below:  Review of Systems  Constitutional: Negative.   HENT: Negative.   Eyes: Negative.   Respiratory: Negative.   Cardiovascular: Negative.   Gastrointestinal: Negative.   Genitourinary: Negative.   Musculoskeletal: Negative.   Skin: Negative.   Neurological: Negative.   Endo/Heme/Allergies: Negative.   Psychiatric/Behavioral: Negative.     Family  History  Problem Relation Age of Onset  . Diabetes Mother   . Stroke Father   . Alcoholism Father   . Hypertension Mother   . Dementia Father     Social History   Social History  . Marital Status: Married    Spouse Name: N/A  . Number of Children: 2  . Years of Education: 14   Occupational History  . Management    Social History Main Topics  . Smoking status: Never Smoker   . Smokeless tobacco: Not on file  . Alcohol Use: 0.0 oz/week    0 Standard drinks or equivalent per week     Comment: 1-2 drinks per month  . Drug Use: No  . Sexual Activity: Not on file   Other Topics Concern  . Not on file   Social History Narrative   Lives at home with husband and son.   1-2 cups caffeine per day.   Right-handed.       Environmental and Social history  Lives in a house with a dry environment, no animals located inside the household, no carpeting in the bedroom, no plastic on the bed or pillow, and no smokers located inside the household. 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  HENT:  Head: Normocephalic. Head is without right periorbital erythema and without left periorbital erythema.  Right Ear: External ear and ear canal normal. Tympanic membrane is scarred.  Left Ear: External ear and ear canal normal. Tympanic membrane is scarred and retracted (Retraction pocket).  Nose: Nose normal. No mucosal edema or rhinorrhea.  Mouth/Throat: Oropharynx is clear and moist and mucous membranes are normal. No oropharyngeal exudate.  Eyes: Conjunctivae and lids are normal. Pupils are equal, round, and reactive to light.  Neck: Trachea normal. No tracheal deviation present. No thyromegaly present.    Cardiovascular: Normal rate, regular rhythm, S1 normal, S2 normal and normal heart sounds.   No murmur heard. Pulmonary/Chest: Effort normal. No stridor. No tachypnea. No respiratory distress. She has no wheezes. She has no rales. She exhibits no tenderness.  Abdominal: Soft. She exhibits no distension and no mass. There is no hepatosplenomegaly. There is no tenderness. There is no rebound and no guarding.  Musculoskeletal: She exhibits no edema or tenderness.  Lymphadenopathy:       Head (right side): No tonsillar adenopathy present.       Head (left side): No tonsillar adenopathy present.    She has no cervical adenopathy.    She has no axillary adenopathy.  Neurological: She is alert. Gait normal.  Skin: No rash noted. She is not diaphoretic. No erythema. No pallor. Nails show no clubbing.  Psychiatric: Mood and affect normal.     Diagnostics: Allergy skin tests were performed. She demonstrated  positive results to house dust mite and mold.  Spirometry was performed and demonstrated an FEV1 of 2.92 @ 125 % of predicted.  The patient had an Asthma Control Test with the following results:  .     Assessment and Plan:    1. Allergic rhinoconjunctivitis   2. LPRD (laryngopharyngeal reflux disease)   3. Asthma, mild intermittent, well-controlled     1. Allergen avoidance measures  2. Treat and prevent inflammation:   A. continue Flonase 1-2 sprays each nostril one time per day  B. continue montelukast 10 mg daily  3. Treat and prevent reflux:   A. minimize all caffeine and chocolate consumption  B. omeprazole 20 mg once a day in a.m.  C. ranitidine 300 mg once a day in p.m.  4. If needed:   A. nasal saline spray  B. OTC antihistamine  C. Ventolin HFA 2 puffs every 4-6 hours  5. Return to clinic in 4 weeks or earlier if problem  I will assume that Tina Lester has a combination of provoking factors for her respiratory tract inflammation and irritation and have her utilize  the therapy mentioned above which includes treatment directed against inflammation as well as reflux. Hopefully over the course of the next 4 weeks he will improved tremendously and we can then eliminate or taper down some of her medications. She'll contact me during the interval should there be a significant problem.  Jessica Priest, MD [facility] [place]

## 2016-02-16 NOTE — Patient Instructions (Signed)
  1. Allergen avoidance measures  2. Treat and prevent inflammation:   A. continue Flonase 1-2 sprays each nostril one time per day  B. continue montelukast 10 mg daily  3. Treat and prevent reflux:   A. minimize all caffeine and chocolate consumption  B. omeprazole 20 mg once a day in a.m.  C. ranitidine 300 mg once a day in p.m.  4. If needed:   A. nasal saline spray  B. OTC antihistamine  C. Ventolin HFA 2 puffs every 4-6 hours  5. Return to clinic in 4 weeks or earlier if problem

## 2016-05-23 ENCOUNTER — Other Ambulatory Visit: Payer: Self-pay | Admitting: Obstetrics and Gynecology

## 2016-05-24 LAB — CYTOLOGY - PAP

## 2017-01-03 DIAGNOSIS — M5413 Radiculopathy, cervicothoracic region: Secondary | ICD-10-CM | POA: Diagnosis not present

## 2017-01-03 DIAGNOSIS — M9901 Segmental and somatic dysfunction of cervical region: Secondary | ICD-10-CM | POA: Diagnosis not present

## 2017-01-03 DIAGNOSIS — M9902 Segmental and somatic dysfunction of thoracic region: Secondary | ICD-10-CM | POA: Diagnosis not present

## 2017-01-04 DIAGNOSIS — J0100 Acute maxillary sinusitis, unspecified: Secondary | ICD-10-CM | POA: Diagnosis not present

## 2017-01-04 DIAGNOSIS — J209 Acute bronchitis, unspecified: Secondary | ICD-10-CM | POA: Diagnosis not present

## 2017-01-10 DIAGNOSIS — H9193 Unspecified hearing loss, bilateral: Secondary | ICD-10-CM | POA: Diagnosis not present

## 2017-01-10 DIAGNOSIS — H9190 Unspecified hearing loss, unspecified ear: Secondary | ICD-10-CM | POA: Diagnosis not present

## 2017-01-10 DIAGNOSIS — H6593 Unspecified nonsuppurative otitis media, bilateral: Secondary | ICD-10-CM | POA: Diagnosis not present

## 2017-01-10 DIAGNOSIS — H905 Unspecified sensorineural hearing loss: Secondary | ICD-10-CM | POA: Diagnosis not present

## 2017-02-05 DIAGNOSIS — Z8673 Personal history of transient ischemic attack (TIA), and cerebral infarction without residual deficits: Secondary | ICD-10-CM | POA: Diagnosis not present

## 2017-02-05 DIAGNOSIS — M542 Cervicalgia: Secondary | ICD-10-CM | POA: Diagnosis not present

## 2017-02-05 DIAGNOSIS — I10 Essential (primary) hypertension: Secondary | ICD-10-CM | POA: Diagnosis not present

## 2017-02-05 DIAGNOSIS — M5413 Radiculopathy, cervicothoracic region: Secondary | ICD-10-CM | POA: Diagnosis not present

## 2017-02-05 DIAGNOSIS — M9901 Segmental and somatic dysfunction of cervical region: Secondary | ICD-10-CM | POA: Diagnosis not present

## 2017-02-05 DIAGNOSIS — M9902 Segmental and somatic dysfunction of thoracic region: Secondary | ICD-10-CM | POA: Diagnosis not present

## 2017-02-08 ENCOUNTER — Emergency Department (HOSPITAL_BASED_OUTPATIENT_CLINIC_OR_DEPARTMENT_OTHER)
Admission: EM | Admit: 2017-02-08 | Discharge: 2017-02-08 | Disposition: A | Discharge status: Home / Self Care | Payer: 59 | Attending: Emergency Medicine | Admitting: Emergency Medicine

## 2017-02-08 ENCOUNTER — Encounter (HOSPITAL_BASED_OUTPATIENT_CLINIC_OR_DEPARTMENT_OTHER): Payer: Self-pay | Admitting: Emergency Medicine

## 2017-02-08 DIAGNOSIS — Z79899 Other long term (current) drug therapy: Secondary | ICD-10-CM | POA: Insufficient documentation

## 2017-02-08 DIAGNOSIS — I10 Essential (primary) hypertension: Secondary | ICD-10-CM | POA: Diagnosis not present

## 2017-02-08 DIAGNOSIS — J45909 Unspecified asthma, uncomplicated: Secondary | ICD-10-CM | POA: Insufficient documentation

## 2017-02-08 DIAGNOSIS — Z7982 Long term (current) use of aspirin: Secondary | ICD-10-CM | POA: Diagnosis not present

## 2017-02-08 DIAGNOSIS — R040 Epistaxis: Secondary | ICD-10-CM | POA: Diagnosis not present

## 2017-02-08 DIAGNOSIS — Z8673 Personal history of transient ischemic attack (TIA), and cerebral infarction without residual deficits: Secondary | ICD-10-CM | POA: Diagnosis not present

## 2017-02-08 HISTORY — DX: Antiphospholipid syndrome: D68.61

## 2017-02-08 HISTORY — DX: Pure hypercholesterolemia, unspecified: E78.00

## 2017-02-08 HISTORY — DX: Essential (primary) hypertension: I10

## 2017-02-08 MED ORDER — OXYMETAZOLINE HCL 0.05 % NA SOLN
1.0000 | Freq: Once | NASAL | Status: AC
  Administered 2017-02-08: 1 via NASAL
  Filled 2017-02-08: qty 15

## 2017-02-08 MED ORDER — SILVER NITRATE-POT NITRATE 75-25 % EX MISC
1.0000 | Freq: Once | CUTANEOUS | Status: AC
  Administered 2017-02-08: 1 via TOPICAL
  Filled 2017-02-08: qty 1

## 2017-02-08 NOTE — ED Notes (Signed)
Pa at bedside, supplies gathered, ent cart to bedside.

## 2017-02-08 NOTE — Discharge Instructions (Signed)
Apply afrin if bleeding starts again. Hold firm pressure for . If bleeding does not stop, return to emergency dept. Follow up with family doctor as needed.

## 2017-02-08 NOTE — ED Notes (Signed)
Patient denies pain and is resting comfortably.  

## 2017-02-08 NOTE — ED Notes (Signed)
No nasal bleeding noted

## 2017-02-08 NOTE — ED Triage Notes (Signed)
Nosebleed x 2 hours

## 2017-02-08 NOTE — ED Provider Notes (Signed)
[facility] Provider Note   CSN: [id number] Arrival date & time: 02/08/17  1100     History   Chief Complaint Chief Complaint  Patient presents with  . Epistaxis    HPI Tina Lester is a 56 y.o. female.  HPI Tina RushingDenise S Altergott is a 56 y.o. female with history of antiphospholipid antibody syndrome, TIA, hypertension, presents to emergency department complaining of a nasal bleed. Patient states she was at work, states she blew her nose and her nose started to bleed. She states she has tried to put pressure, try ice pack, with no relief of bleeding. Patient saw a nurse at her job, who told her to come to emergency department. Patient denies any bleeding disorders. She is on full aspirin daily. She denies any prior similar symptoms. She denies any headache.  Past Medical History:  Diagnosis Date  . 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 Antiphospholipid antibody syndrome (HCC)   . Asthma   . Degenerative disc disease, cervical   . Degenerative joint disease   . High cholesterol   . Hypertension   . TIA (transient ischemic attack)     Patient Active Problem List   Diagnosis Date Noted  . 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 Spinal stenosis in cervical region 10/21/2015  . TIA (transient ischemic attack) 10/21/2015  . BRONCHITIS 11/02/2009    Past Surgical History:  Procedure Laterality Date  . ABLATION    . GALLBLADDER SURGERY    . TONSILLECTOMY    . TUBAL LIGATION      OB History    No data available       Home Medications    Prior to Admission medications   Medication Sig Start Date End Date Taking? Authorizing Provider  albuterol (PROVENTIL HFA;VENTOLIN HFA) 108 (90 Base) MCG/ACT inhaler Inhale 2 puffs into the lungs every 6 (six) hours as needed for wheezing or shortness of breath. 02/16/16  Yes Jessica PriestEric J Kozlow, MD  aspirin 325 MG tablet Take 325 mg by mouth daily.   Yes Historical Provider, MD    fluticasone (FLONASE) 50 MCG/ACT nasal spray 1-2 sprays each nostril one time per day 02/16/16  Yes Jessica PriestEric J Kozlow, MD  hydrochlorothiazide (MICROZIDE) 12.5 MG capsule Take 12.5 mg by mouth daily. 08/13/15  Yes Historical Provider, MD  ALBUTEROL IN Inhale into the lungs as needed.    Historical Provider, MD  aspirin 81 MG tablet Take 81 mg by mouth daily. Reported on 02/16/2016    Historical Provider, MD  fluticasone (FLONASE) 50 MCG/ACT nasal spray Place into both nostrils daily.    Historical Provider, MD  levalbuterol [facility](XOPENEX HFA) 45 MCG/ACT inhaler Inhale 2 puffs into the lungs every 4 (four) hours as needed for wheezing.    Historical Provider, MD  levalbuterol (XOPENEX) 1.25 MG/0.5ML nebulizer solution Take 1.25 mg by nebulization every 4 (four) hours as needed for wheezing or shortness of breath.    Historical Provider, MD  montelukast (SINGULAIR) 10 MG tablet Take 10 mg by mouth at bedtime.    Historical Provider, MD  omeprazole (PRILOSEC) 20 MG capsule Take 1 capsule (20 mg total) by mouth daily. 02/16/16   Jessica PriestEric J Kozlow, MD  ranitidine (ZANTAC) 300 MG tablet Take 1 tablet (300 mg total) by mouth at bedtime. 02/16/16   Jessica PriestEric J Kozlow, MD    Family History Family History  Problem Relation Age of Onset  . Diabetes Mother   . Hypertension Mother   . Stroke Father   .  Alcoholism Father   . Dementia Father     Social History Social History  Substance Use Topics  . Smoking status: Never Smoker  . Smokeless tobacco: Never Used  . Alcohol use 0.0 oz/week     Comment: 1-2 drinks per month     Allergies   Patient has no known allergies.   Review of Systems Review of Systems  Constitutional: Negative for chills and fever.  HENT: Positive for nosebleeds.   Respiratory: Negative for cough, chest tightness and shortness of breath.   Cardiovascular: Negative for chest pain, palpitations and leg swelling.  Gastrointestinal: Negative for abdominal pain, diarrhea, nausea and vomiting.   Genitourinary: Negative for dysuria, flank pain and pelvic pain.  Musculoskeletal: Negative for arthralgias, myalgias, neck pain and neck stiffness.  Skin: Negative for rash.  Neurological: Negative for dizziness, weakness and headaches.  Hematological: Does not bruise/bleed easily.  All other systems reviewed and are negative.    Physical Exam Updated Vital Signs BP 170/97 (BP Location: Right Arm)   Pulse 92   Temp 98.7 F (37.1 C) (Oral)   Resp 20   Ht 5\' 2"  (1.575 m)   Wt 92.5 kg   SpO2 99%   BMI 37.31 kg/m   Physical Exam  Constitutional: She is oriented to person, place, and time. She appears well-developed and well-nourished. No distress.  HENT:  Head: Normocephalic and atraumatic.  Right Ear: Tympanic membrane, external ear and ear canal normal.  Left Ear: Tympanic membrane, external ear and ear canal normal.  Nose: Mucosal edema and rhinorrhea present.  Mouth/Throat: Uvula is midline and mucous membranes are normal. Posterior oropharyngeal erythema present. No oropharyngeal exudate, posterior oropharyngeal edema or tonsillar abscesses.  Active bleeding and large blood clots in the left near.  Eyes: Conjunctivae are normal.  Neck: Neck supple.  Cardiovascular: Normal rate, regular rhythm, normal heart sounds and intact distal pulses.   Pulmonary/Chest: Effort normal and breath sounds normal. No respiratory distress. She has no wheezes. She has no rales.  Abdominal: She exhibits no distension.  Musculoskeletal: Normal range of motion.  Neurological: She is alert and oriented to person, place, and time.  Skin: Skin is warm and dry.  Psychiatric: She has a normal mood and affect.  Nursing note and vitals reviewed.    ED Treatments / Results  Labs (all labs ordered are listed, but only abnormal results are displayed) Labs Reviewed - No data to display  EKG  EKG Interpretation None       Radiology No results found.  Procedures Procedures (including  critical care time)  Medications Ordered in ED Medications  oxymetazoline (AFRIN) 0.05 % nasal spray 1 spray (not administered)     Initial Impression / Assessment and Plan / ED Course  I have reviewed the triage vital signs and the nursing notes.  Pertinent labs & imaging results that were available during my care of the patient were reviewed by me and considered in my medical decision making (see chart for details).    Patient emergency department with bleeding from the left near. Blood pressure slightly elevated, patient also states she has been increasing and taking double aspirin for her symptoms over the last weekend. I have made patient blow up all the clots. Large clots out of the left nostril. Afrin applied. Patient held pressure for 15 minutes.  After recheck, no bleeding. I did not see any active bleeding from the left nostril, initially plan was to use silver nitrate, but unable to see the bleeding  so unable to cauterize. Will monitor patient. Blood pressure rechecked, normal.   Pt monitored for 2hrs in ED. No bleeding. Stable for DC home with afrin. Return precautions discussed.   Final Clinical Impressions(s) / ED Diagnoses   Final diagnoses:  Epistaxis    New Prescriptions New Prescriptions   No medications on file     Jaynie Crumble, PA-C 02/08/17 1349    Lorre Nick, MD 02/09/17 [phone]

## 2017-02-14 DIAGNOSIS — M5413 Radiculopathy, cervicothoracic region: Secondary | ICD-10-CM | POA: Diagnosis not present

## 2017-02-14 DIAGNOSIS — M9901 Segmental and somatic dysfunction of cervical region: Secondary | ICD-10-CM | POA: Diagnosis not present

## 2017-02-14 DIAGNOSIS — M9902 Segmental and somatic dysfunction of thoracic region: Secondary | ICD-10-CM | POA: Diagnosis not present

## 2017-02-27 DIAGNOSIS — Z7982 Long term (current) use of aspirin: Secondary | ICD-10-CM | POA: Diagnosis not present

## 2017-02-27 DIAGNOSIS — J3489 Other specified disorders of nose and nasal sinuses: Secondary | ICD-10-CM | POA: Diagnosis not present

## 2017-02-27 DIAGNOSIS — Z87898 Personal history of other specified conditions: Secondary | ICD-10-CM | POA: Diagnosis not present

## 2017-02-28 DIAGNOSIS — M9901 Segmental and somatic dysfunction of cervical region: Secondary | ICD-10-CM | POA: Diagnosis not present

## 2017-02-28 DIAGNOSIS — M9902 Segmental and somatic dysfunction of thoracic region: Secondary | ICD-10-CM | POA: Diagnosis not present

## 2017-02-28 DIAGNOSIS — M5413 Radiculopathy, cervicothoracic region: Secondary | ICD-10-CM | POA: Diagnosis not present

## 2017-03-07 DIAGNOSIS — Z79899 Other long term (current) drug therapy: Secondary | ICD-10-CM | POA: Diagnosis not present

## 2017-03-07 DIAGNOSIS — I10 Essential (primary) hypertension: Secondary | ICD-10-CM | POA: Diagnosis not present

## 2017-03-07 DIAGNOSIS — E785 Hyperlipidemia, unspecified: Secondary | ICD-10-CM | POA: Diagnosis not present

## 2017-03-12 DIAGNOSIS — M542 Cervicalgia: Secondary | ICD-10-CM | POA: Diagnosis not present

## 2017-03-12 DIAGNOSIS — Z8673 Personal history of transient ischemic attack (TIA), and cerebral infarction without residual deficits: Secondary | ICD-10-CM | POA: Diagnosis not present

## 2017-03-12 DIAGNOSIS — N202 Calculus of kidney with calculus of ureter: Secondary | ICD-10-CM | POA: Diagnosis not present

## 2017-03-12 DIAGNOSIS — R209 Unspecified disturbances of skin sensation: Secondary | ICD-10-CM | POA: Diagnosis not present

## 2017-03-28 DIAGNOSIS — M9902 Segmental and somatic dysfunction of thoracic region: Secondary | ICD-10-CM | POA: Diagnosis not present

## 2017-03-28 DIAGNOSIS — M9901 Segmental and somatic dysfunction of cervical region: Secondary | ICD-10-CM | POA: Diagnosis not present

## 2017-03-28 DIAGNOSIS — M5413 Radiculopathy, cervicothoracic region: Secondary | ICD-10-CM | POA: Diagnosis not present

## 2017-04-10 DIAGNOSIS — K591 Functional diarrhea: Secondary | ICD-10-CM | POA: Diagnosis not present

## 2017-04-11 DIAGNOSIS — K591 Functional diarrhea: Secondary | ICD-10-CM | POA: Diagnosis not present

## 2017-04-24 DIAGNOSIS — M542 Cervicalgia: Secondary | ICD-10-CM | POA: Diagnosis not present

## 2017-04-24 DIAGNOSIS — M9902 Segmental and somatic dysfunction of thoracic region: Secondary | ICD-10-CM | POA: Diagnosis not present

## 2017-04-24 DIAGNOSIS — M9901 Segmental and somatic dysfunction of cervical region: Secondary | ICD-10-CM | POA: Diagnosis not present

## 2017-05-25 DIAGNOSIS — E785 Hyperlipidemia, unspecified: Secondary | ICD-10-CM | POA: Diagnosis not present

## 2017-05-25 DIAGNOSIS — Z79899 Other long term (current) drug therapy: Secondary | ICD-10-CM | POA: Diagnosis not present

## 2017-05-25 DIAGNOSIS — I10 Essential (primary) hypertension: Secondary | ICD-10-CM | POA: Diagnosis not present

## 2017-06-07 DIAGNOSIS — Z124 Encounter for screening for malignant neoplasm of cervix: Secondary | ICD-10-CM | POA: Diagnosis not present

## 2017-06-07 DIAGNOSIS — Z1231 Encounter for screening mammogram for malignant neoplasm of breast: Secondary | ICD-10-CM | POA: Diagnosis not present

## 2017-06-07 DIAGNOSIS — Z01419 Encounter for gynecological examination (general) (routine) without abnormal findings: Secondary | ICD-10-CM | POA: Diagnosis not present

## 2017-06-08 ENCOUNTER — Other Ambulatory Visit: Payer: Self-pay | Admitting: Obstetrics and Gynecology

## 2017-06-08 DIAGNOSIS — R928 Other abnormal and inconclusive findings on diagnostic imaging of breast: Secondary | ICD-10-CM

## 2017-06-15 ENCOUNTER — Other Ambulatory Visit: Payer: Self-pay | Admitting: Obstetrics and Gynecology

## 2017-06-15 ENCOUNTER — Ambulatory Visit
Admission: RE | Admit: 2017-06-15 | Discharge: 2017-06-15 | Disposition: A | Discharge status: Home / Self Care | Payer: 59 | Source: Ambulatory Visit | Attending: Obstetrics and Gynecology | Admitting: Obstetrics and Gynecology

## 2017-06-15 DIAGNOSIS — R928 Other abnormal and inconclusive findings on diagnostic imaging of breast: Secondary | ICD-10-CM

## 2017-06-15 DIAGNOSIS — R922 Inconclusive mammogram: Secondary | ICD-10-CM | POA: Diagnosis not present

## 2017-06-15 DIAGNOSIS — N6313 Unspecified lump in the right breast, lower outer quadrant: Secondary | ICD-10-CM | POA: Diagnosis not present

## 2017-06-15 DIAGNOSIS — N6310 Unspecified lump in the right breast, unspecified quadrant: Secondary | ICD-10-CM

## 2017-06-18 ENCOUNTER — Ambulatory Visit
Admission: RE | Admit: 2017-06-18 | Discharge: 2017-06-18 | Disposition: A | Discharge status: Home / Self Care | Payer: 59 | Source: Ambulatory Visit | Attending: Obstetrics and Gynecology | Admitting: Obstetrics and Gynecology

## 2017-06-18 ENCOUNTER — Other Ambulatory Visit: Payer: 59

## 2017-06-18 DIAGNOSIS — N6310 Unspecified lump in the right breast, unspecified quadrant: Secondary | ICD-10-CM

## 2017-06-18 DIAGNOSIS — N6011 Diffuse cystic mastopathy of right breast: Secondary | ICD-10-CM | POA: Diagnosis not present

## 2017-06-18 DIAGNOSIS — R928 Other abnormal and inconclusive findings on diagnostic imaging of breast: Secondary | ICD-10-CM | POA: Diagnosis not present

## 2017-06-25 DIAGNOSIS — M9905 Segmental and somatic dysfunction of pelvic region: Secondary | ICD-10-CM | POA: Diagnosis not present

## 2017-06-25 DIAGNOSIS — M9903 Segmental and somatic dysfunction of lumbar region: Secondary | ICD-10-CM | POA: Diagnosis not present

## 2017-06-25 DIAGNOSIS — M545 Low back pain: Secondary | ICD-10-CM | POA: Diagnosis not present

## 2017-07-17 DIAGNOSIS — M9901 Segmental and somatic dysfunction of cervical region: Secondary | ICD-10-CM | POA: Diagnosis not present

## 2017-07-17 DIAGNOSIS — M542 Cervicalgia: Secondary | ICD-10-CM | POA: Diagnosis not present

## 2017-07-17 DIAGNOSIS — M9902 Segmental and somatic dysfunction of thoracic region: Secondary | ICD-10-CM | POA: Diagnosis not present

## 2017-08-23 DIAGNOSIS — M9902 Segmental and somatic dysfunction of thoracic region: Secondary | ICD-10-CM | POA: Diagnosis not present

## 2017-08-23 DIAGNOSIS — M542 Cervicalgia: Secondary | ICD-10-CM | POA: Diagnosis not present

## 2017-08-23 DIAGNOSIS — M9901 Segmental and somatic dysfunction of cervical region: Secondary | ICD-10-CM | POA: Diagnosis not present

## 2017-09-03 DIAGNOSIS — M79646 Pain in unspecified finger(s): Secondary | ICD-10-CM | POA: Diagnosis not present

## 2017-09-05 DIAGNOSIS — M79642 Pain in left hand: Secondary | ICD-10-CM | POA: Diagnosis not present

## 2017-09-12 DIAGNOSIS — M65331 Trigger finger, right middle finger: Secondary | ICD-10-CM | POA: Diagnosis not present

## 2017-09-12 DIAGNOSIS — M65312 Trigger thumb, left thumb: Secondary | ICD-10-CM | POA: Diagnosis not present

## 2017-09-12 DIAGNOSIS — M1812 Unilateral primary osteoarthritis of first carpometacarpal joint, left hand: Secondary | ICD-10-CM | POA: Diagnosis not present

## 2017-10-01 DIAGNOSIS — E785 Hyperlipidemia, unspecified: Secondary | ICD-10-CM | POA: Diagnosis not present

## 2017-10-01 DIAGNOSIS — Z79899 Other long term (current) drug therapy: Secondary | ICD-10-CM | POA: Diagnosis not present

## 2017-10-01 DIAGNOSIS — I10 Essential (primary) hypertension: Secondary | ICD-10-CM | POA: Diagnosis not present

## 2017-10-01 DIAGNOSIS — R7301 Impaired fasting glucose: Secondary | ICD-10-CM | POA: Diagnosis not present

## 2017-10-01 DIAGNOSIS — Z8673 Personal history of transient ischemic attack (TIA), and cerebral infarction without residual deficits: Secondary | ICD-10-CM | POA: Diagnosis not present

## 2017-10-01 DIAGNOSIS — J45909 Unspecified asthma, uncomplicated: Secondary | ICD-10-CM | POA: Diagnosis not present

## 2017-10-09 DIAGNOSIS — M79645 Pain in left finger(s): Secondary | ICD-10-CM | POA: Diagnosis not present

## 2017-10-09 DIAGNOSIS — M1812 Unilateral primary osteoarthritis of first carpometacarpal joint, left hand: Secondary | ICD-10-CM | POA: Diagnosis not present

## 2017-10-11 DIAGNOSIS — H6590 Unspecified nonsuppurative otitis media, unspecified ear: Secondary | ICD-10-CM | POA: Diagnosis not present

## 2017-10-11 DIAGNOSIS — J069 Acute upper respiratory infection, unspecified: Secondary | ICD-10-CM | POA: Diagnosis not present

## 2017-10-13 IMAGING — MG 2D DIGITAL DIAGNOSTIC UNILATERAL RIGHT MAMMOGRAM WITH CAD AND AD
6 of 9 series · 6 of 21 positions shown · non-contrast
Comparison: 06/07/2017 and earlier

CLINICAL DATA: Patient returns after screening study for evaluation
of a possible right breast mass.

EXAM:
2D DIGITAL DIAGNOSTIC RIGHT MAMMOGRAM WITH CAD AND ADJUNCT TOMO
ULTRASOUND RIGHT BREAST

[R CC (1 of 2)]
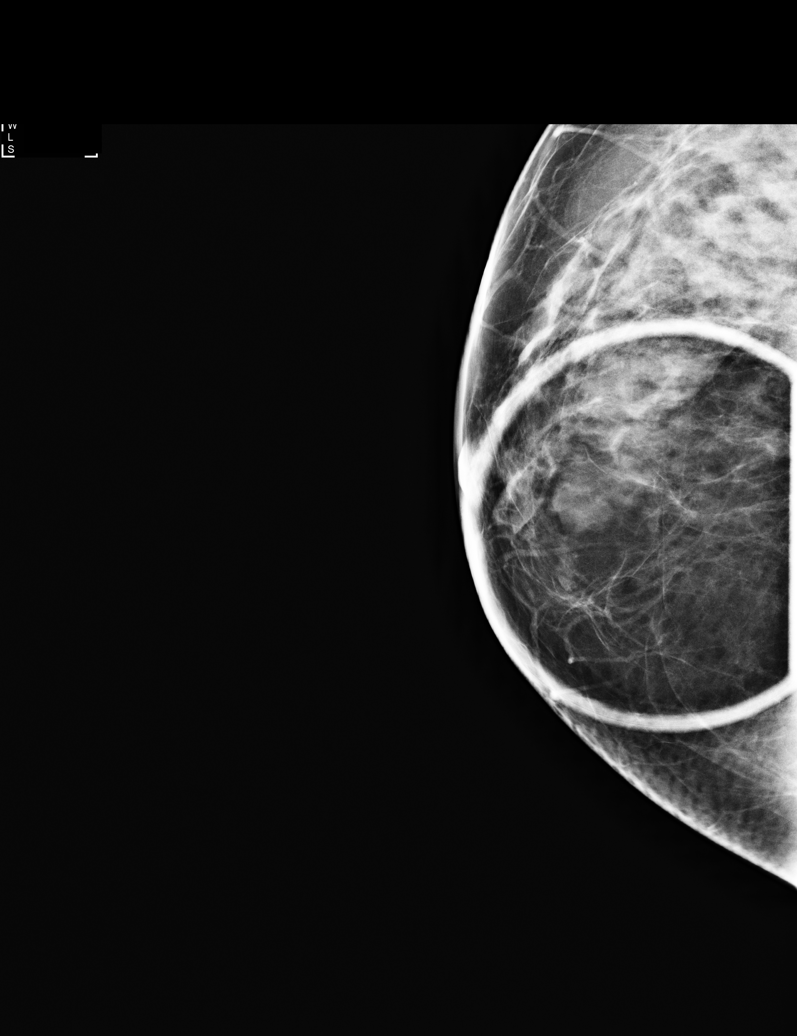

[R MLO synth-2D]
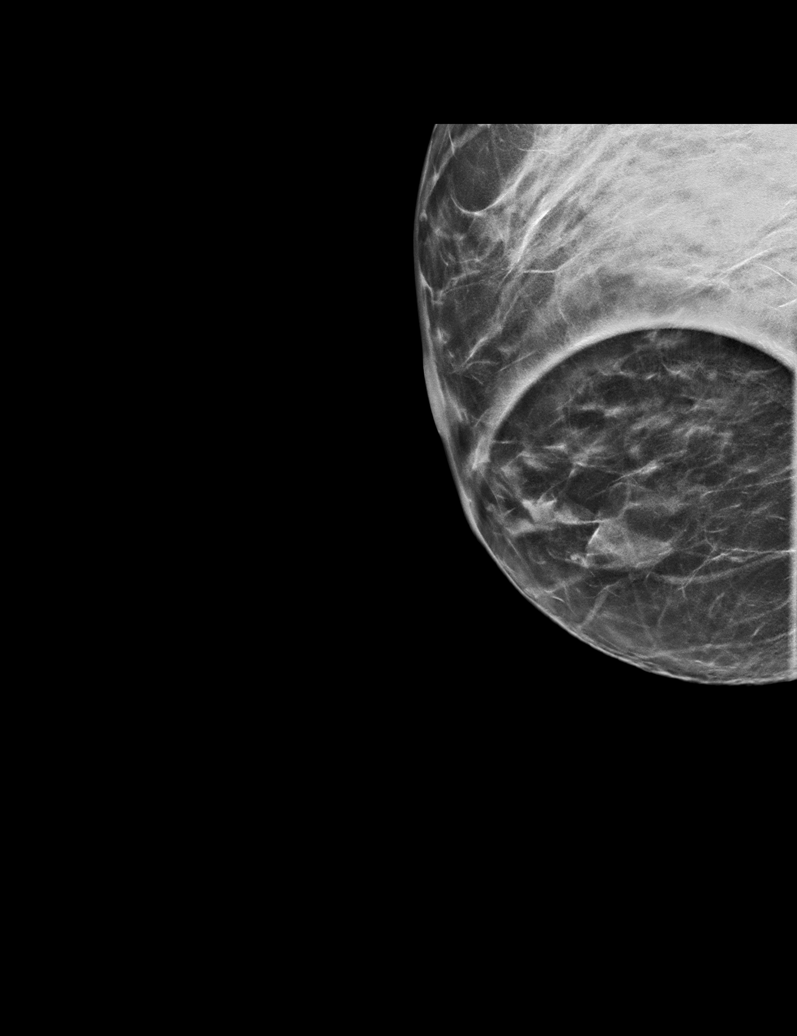

[R CC synth-2D (1 of 2)]
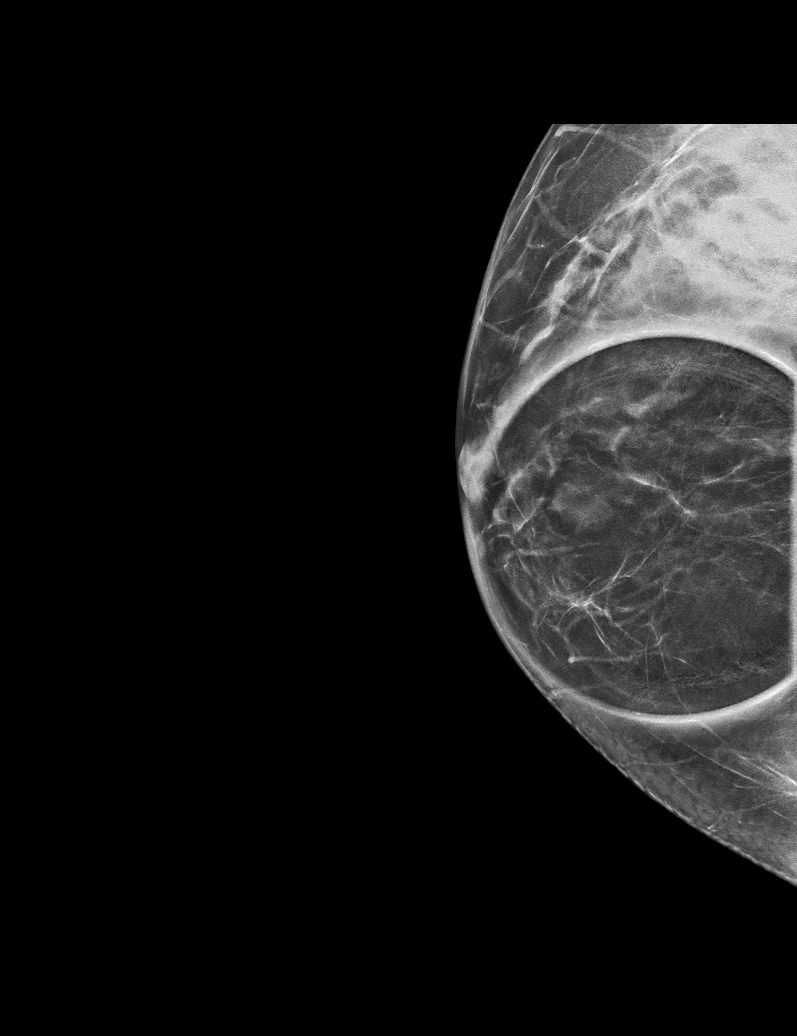

[R CC synth-2D (2 of 2)]
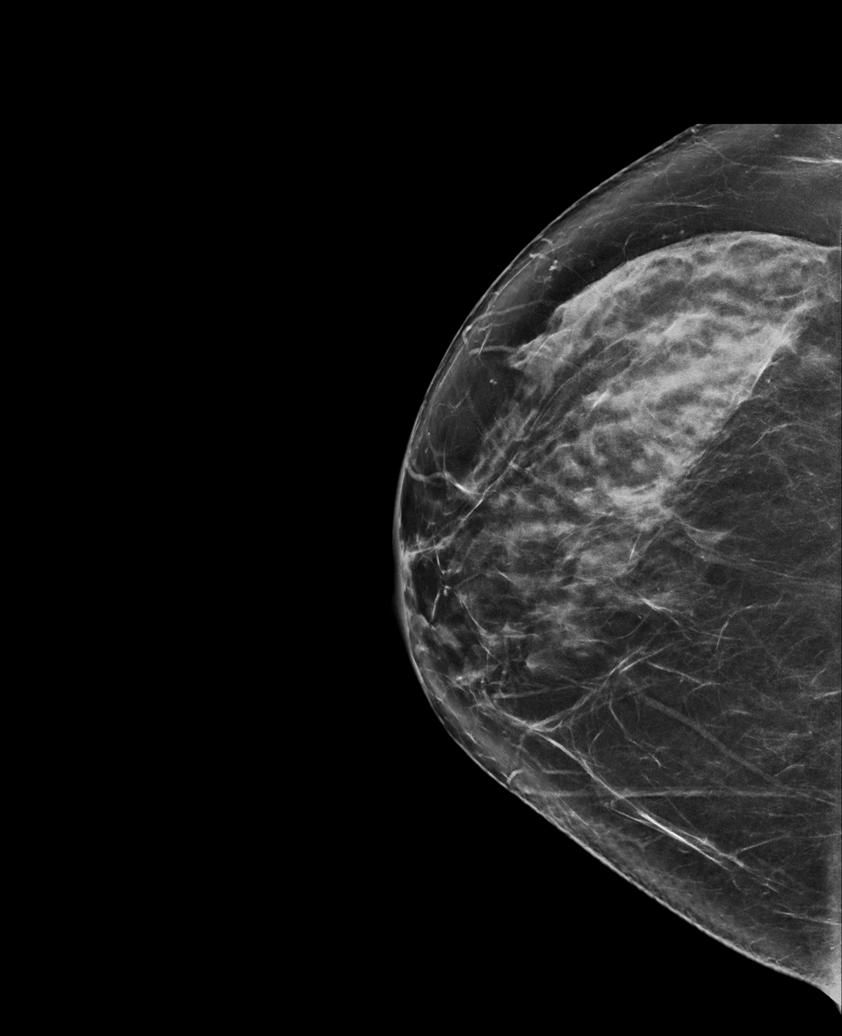

[R CC (2 of 2)]
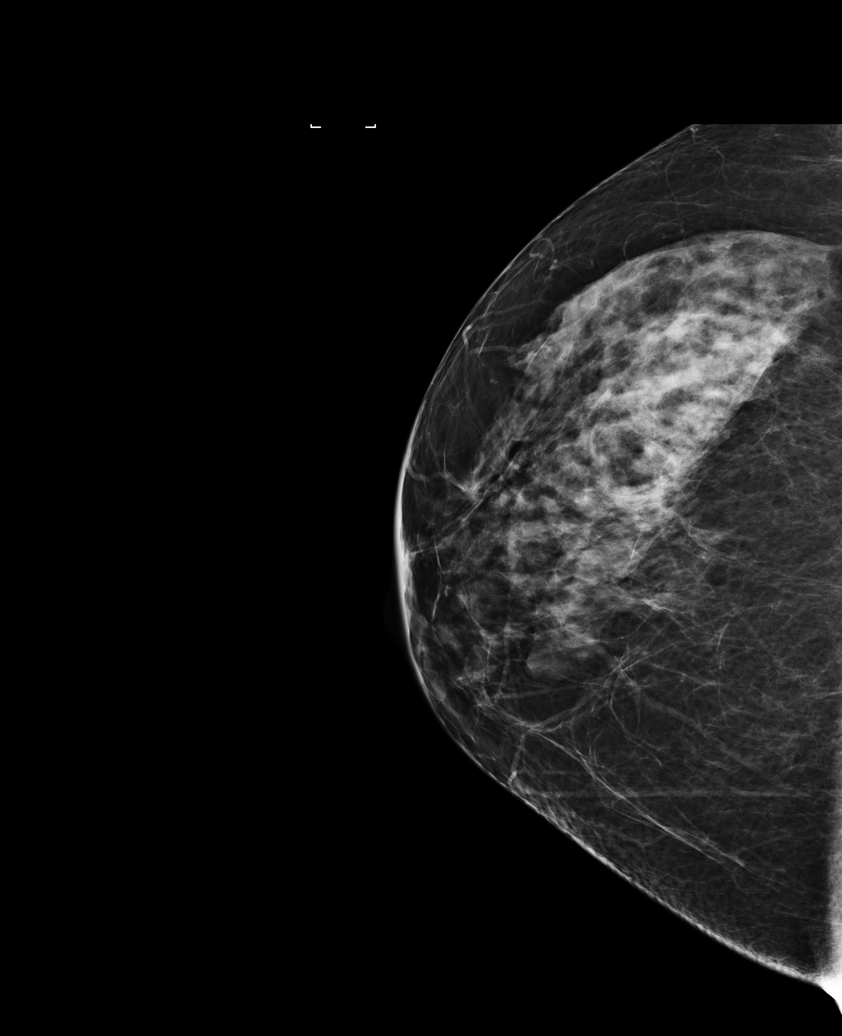

[R MLO]
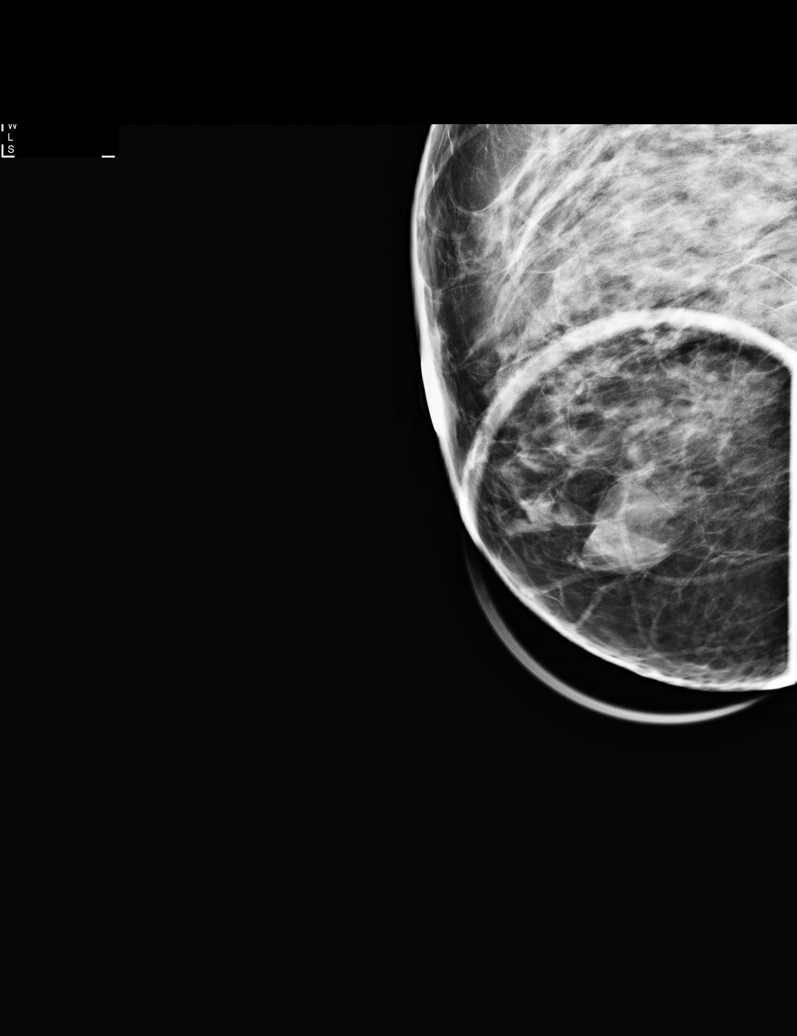

[6 of 21 positions shown; findings below may reference images not displayed]

ACR Breast Density Category c: The breast tissue is heterogeneously
dense, which may obscure small masses.
FINDINGS: Additional 2-D and 3-D images are performed. Views confirm presence
of a possible oval mass in the lower central aspect of the right
breast further evaluated with ultrasound.

Mammographic images were processed with CAD.

On physical exam, I palpate no abnormality in the lower central
aspect of the right breast.

Targeted ultrasound is performed, showing an area of non masslike
hypoechoic mixed echogenicity breast tissue in the 7 o'clock
location of the right breast 5 cm from nipple. There is internal
vascularity on Doppler evaluation. There is next Next posterior
acoustic enhancement and shadowing. This region measures at least
2.7 x 1.0 x 2.0 cm. Evaluation of the axilla is negative for
adenopathy.
IMPRESSION: 1. Area of mixed echogenicity breast tissue in the 7 o'clock
location of the right breast warranting tissue diagnosis.
Considerations include hamartoma, pseudo angiomatous stromal
hyperplasia, fibrosis. However malignancy needs to be excluded.
2. No evidence for adenopathy.

RECOMMENDATION:
Ultrasound-guided core biopsy of this region is recommended.

I have discussed the findings and recommendations with the patient
and her husband. Results were also provided in writing at the
conclusion of the visit. If applicable, a reminder letter will be
sent to the patient regarding the next appointment.

BI-RADS CATEGORY  4: Suspicious.

## 2017-10-16 IMAGING — US US BREAST BX W LOC DEV 1ST LESION IMG BX SPEC US GUIDE*R*
1 series · 13 of 14 positions shown · non-contrast
Comparison: Previous exam(s).

ADDENDUM:
Pathology revealed fibrocystic change with dense fibrosis in the
RIGHT breast. This was found to be concordant by Dr. Kintziger Danelutti.
Pathology results were discussed with the patient by telephone. The
patient reported doing well after the biopsy. Post biopsy
instructions and care were reviewed and questions were answered. The
patient was encouraged to call [REDACTED] for any additional concerns. The patient was instructed to
return for annual screening mammography at [HOSPITAL[facility]
[REDACTED] and informed that a reminder letter would be sent
regarding this appointment.

Pathology results reported by Odilon Lucci RN, BSN on 06/19/2017.
CLINICAL DATA: Patient presents for ultrasound-guided core needle
biopsy of a mixed echogenicity lesion in the 7 o'clock position of
the right breast.
EXAM:
ULTRASOUND GUIDED RIGHT BREAST CORE NEEDLE BIOPSY

[Series 1: us breast bx w loc dev 1st lesion img bx spec us g · 0.07mm/px · 13 of 14 slices shown]
[im 1/14]
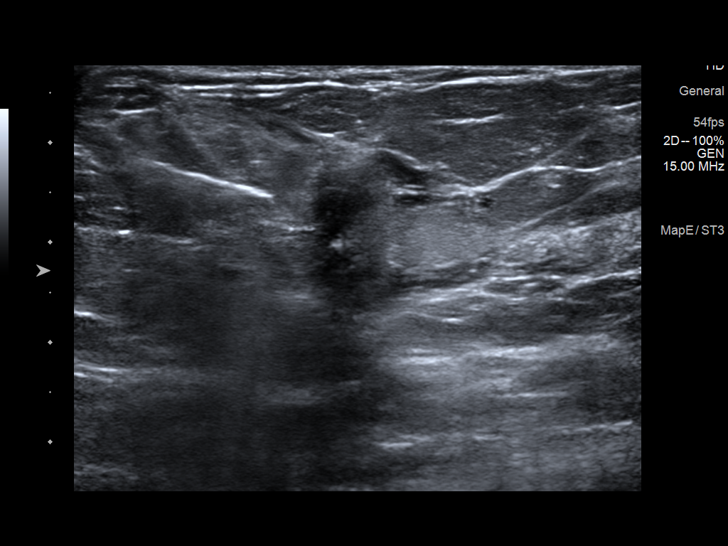
[im 2/14]
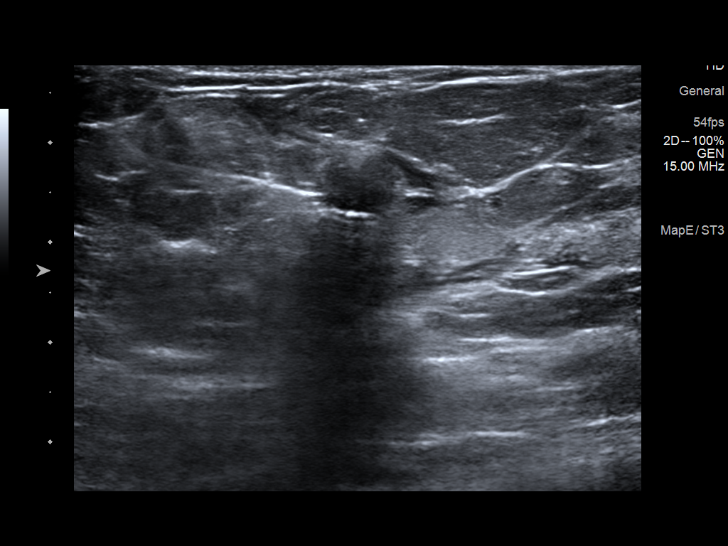
[im 3/14]
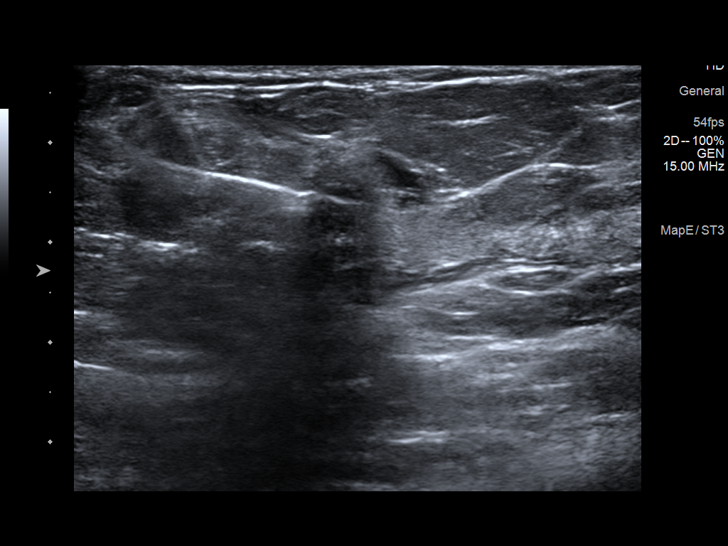
[im 4/14]
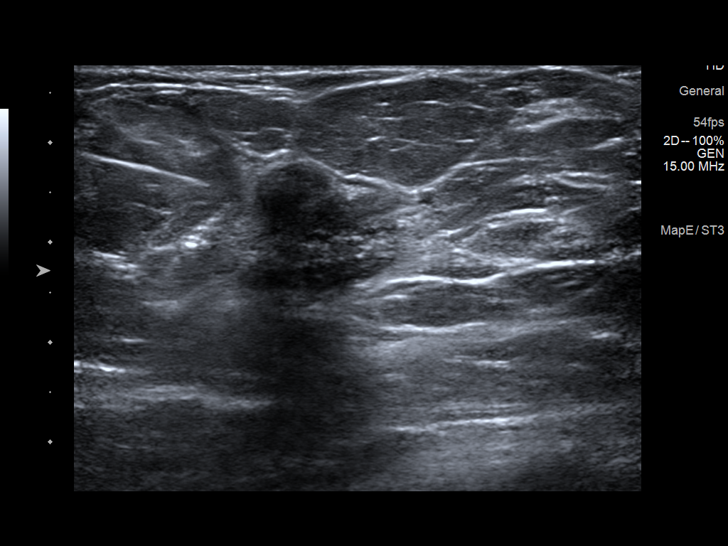
[im 5/14]
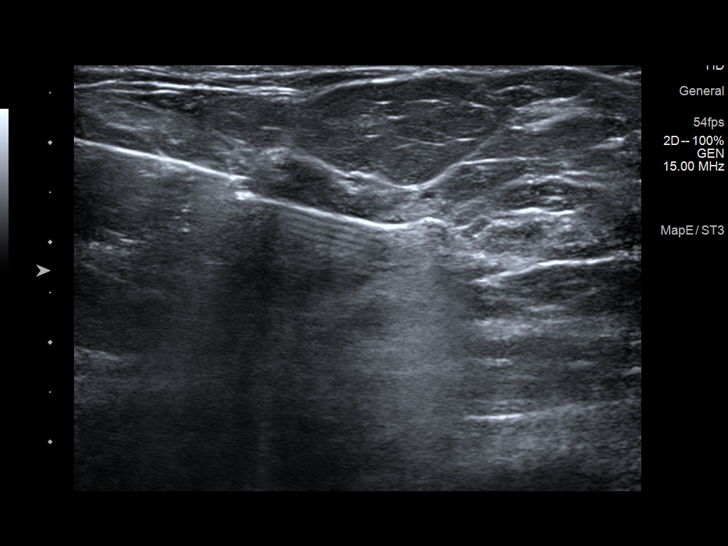
[im 6/14]
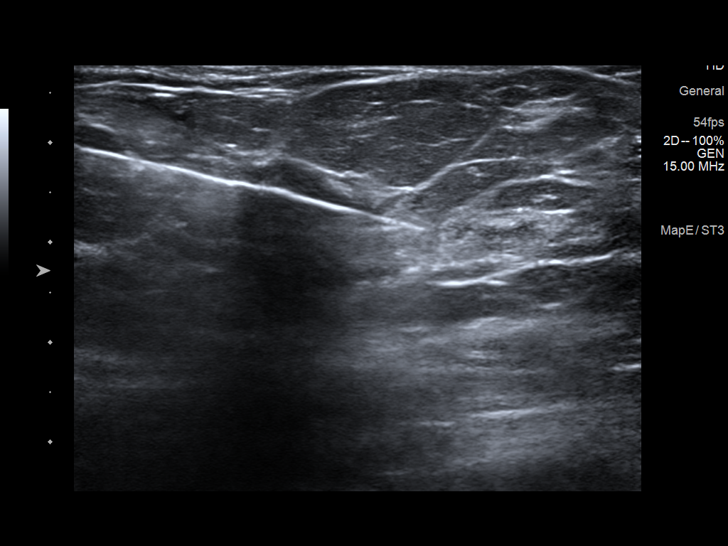
[im 8/14]
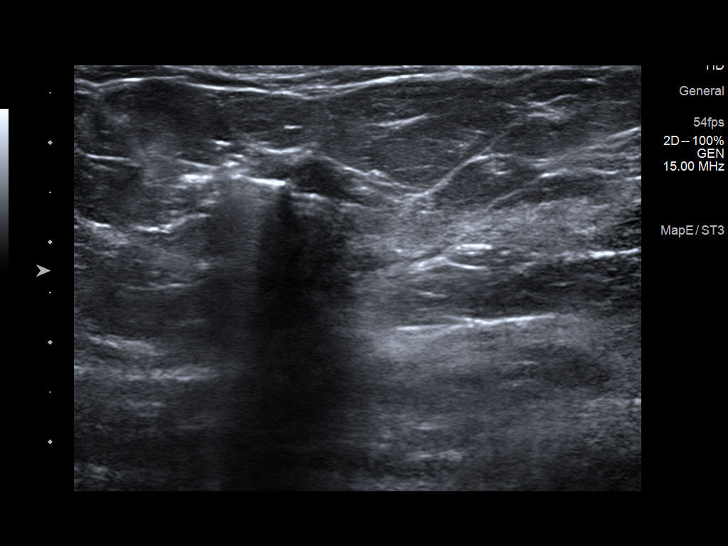
[im 9/14]
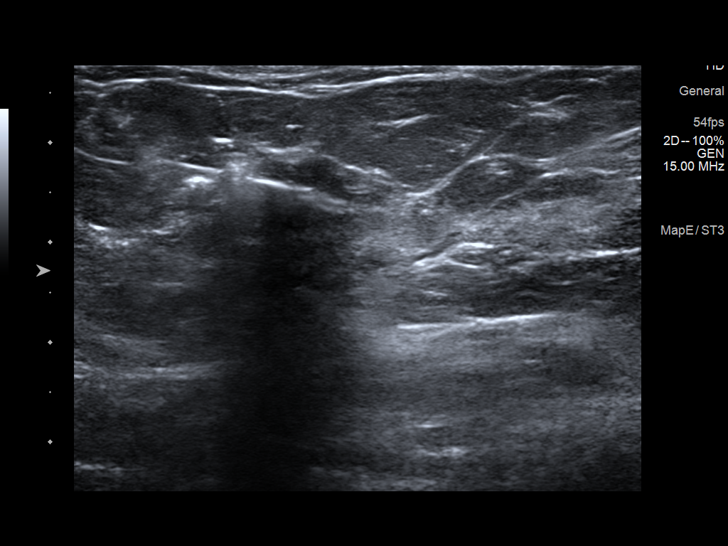
[im 10/14]
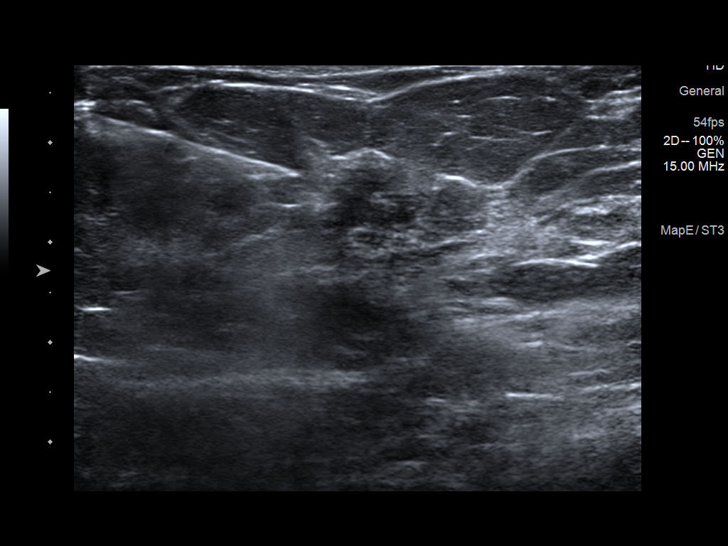
[im 11/14]
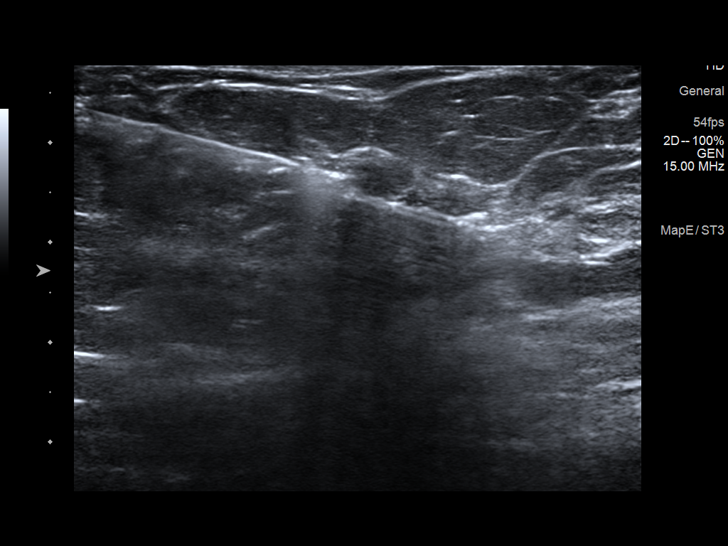
[im 12/14]
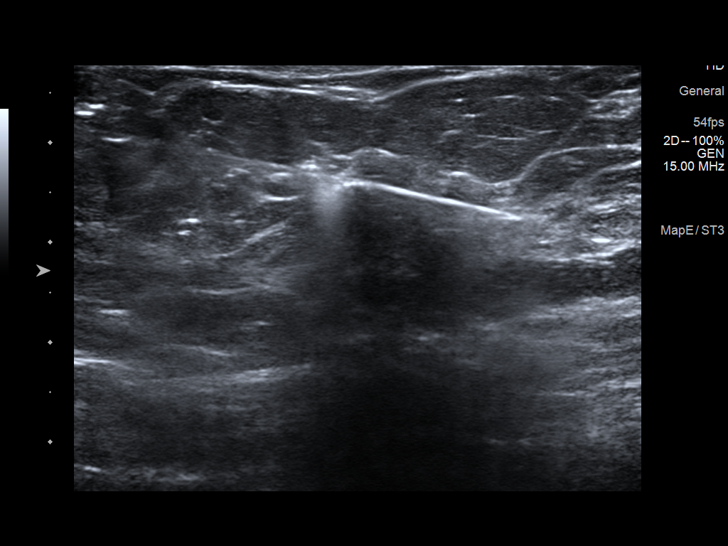
[im 13/14]
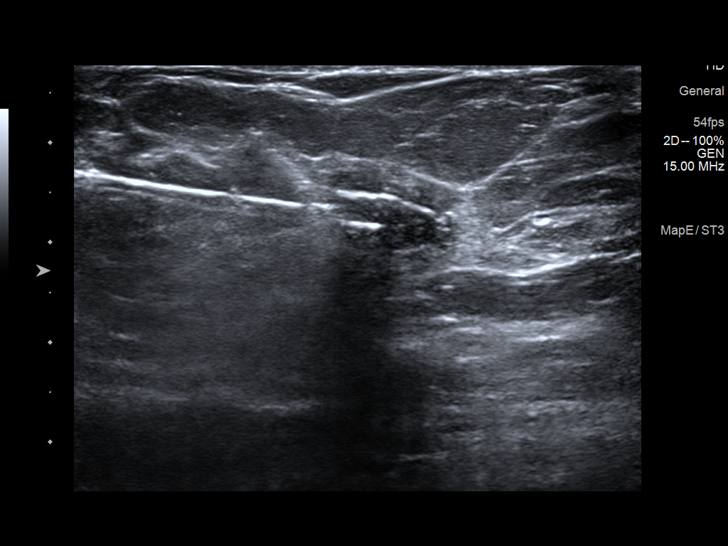
[im 14/14]
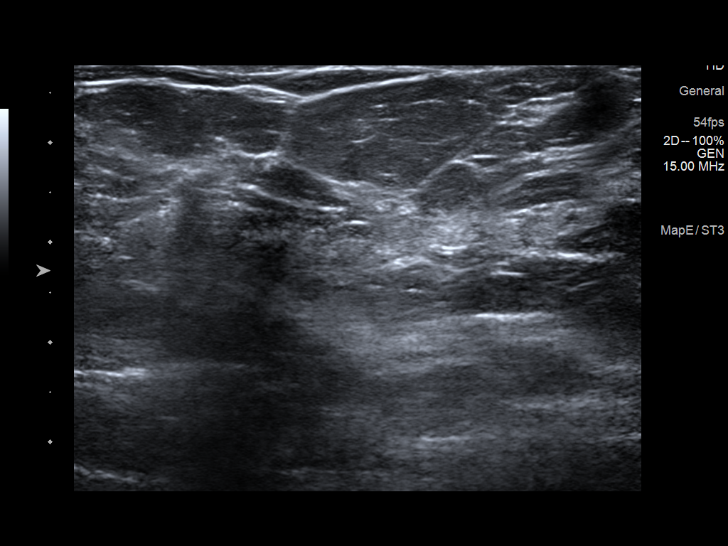

[13 of 14 positions shown; findings below may reference images not displayed]



Lesion quadrant: Lower outer quadrant

Using sterile technique and 1% Lidocaine as local anesthetic, under
direct ultrasound visualization, a 12 gauge Bonitas device was
used to perform biopsy of the focal area of mixed echogenicity using
an inferior approach. At the conclusion of the procedure a U shaped
tissue marker clip was deployed into the biopsy cavity. Follow up 2
view mammogram was performed and dictated separately.
IMPRESSION: Ultrasound guided biopsy of the right breast. No apparent
complications.

## 2017-11-08 DIAGNOSIS — M65872 Other synovitis and tenosynovitis, left ankle and foot: Secondary | ICD-10-CM | POA: Diagnosis not present

## 2017-11-08 DIAGNOSIS — M79672 Pain in left foot: Secondary | ICD-10-CM | POA: Diagnosis not present

## 2017-11-08 DIAGNOSIS — M25572 Pain in left ankle and joints of left foot: Secondary | ICD-10-CM | POA: Diagnosis not present

## 2017-11-08 DIAGNOSIS — M12272 Villonodular synovitis (pigmented), left ankle and foot: Secondary | ICD-10-CM | POA: Diagnosis not present

## 2017-11-12 DIAGNOSIS — M545 Low back pain: Secondary | ICD-10-CM | POA: Diagnosis not present

## 2017-11-12 DIAGNOSIS — M9905 Segmental and somatic dysfunction of pelvic region: Secondary | ICD-10-CM | POA: Diagnosis not present

## 2017-11-12 DIAGNOSIS — M9903 Segmental and somatic dysfunction of lumbar region: Secondary | ICD-10-CM | POA: Diagnosis not present

## 2017-11-26 DIAGNOSIS — M65872 Other synovitis and tenosynovitis, left ankle and foot: Secondary | ICD-10-CM | POA: Diagnosis not present

## 2017-11-26 DIAGNOSIS — M12272 Villonodular synovitis (pigmented), left ankle and foot: Secondary | ICD-10-CM | POA: Diagnosis not present

## 2018-01-23 DIAGNOSIS — M1812 Unilateral primary osteoarthritis of first carpometacarpal joint, left hand: Secondary | ICD-10-CM | POA: Diagnosis not present

## 2018-01-23 DIAGNOSIS — M65312 Trigger thumb, left thumb: Secondary | ICD-10-CM | POA: Diagnosis not present

## 2018-01-23 DIAGNOSIS — M65331 Trigger finger, right middle finger: Secondary | ICD-10-CM | POA: Diagnosis not present

## 2018-02-06 DIAGNOSIS — M9905 Segmental and somatic dysfunction of pelvic region: Secondary | ICD-10-CM | POA: Diagnosis not present

## 2018-02-06 DIAGNOSIS — M545 Low back pain: Secondary | ICD-10-CM | POA: Diagnosis not present

## 2018-02-06 DIAGNOSIS — M9903 Segmental and somatic dysfunction of lumbar region: Secondary | ICD-10-CM | POA: Diagnosis not present

## 2018-02-19 DIAGNOSIS — H1851 Endothelial corneal dystrophy: Secondary | ICD-10-CM | POA: Diagnosis not present

## 2018-02-19 DIAGNOSIS — H25813 Combined forms of age-related cataract, bilateral: Secondary | ICD-10-CM | POA: Diagnosis not present

## 2018-03-13 DIAGNOSIS — M1812 Unilateral primary osteoarthritis of first carpometacarpal joint, left hand: Secondary | ICD-10-CM | POA: Diagnosis not present

## 2018-03-13 DIAGNOSIS — M65312 Trigger thumb, left thumb: Secondary | ICD-10-CM | POA: Diagnosis not present

## 2018-04-18 DIAGNOSIS — H35371 Puckering of macula, right eye: Secondary | ICD-10-CM | POA: Diagnosis not present

## 2018-05-14 DIAGNOSIS — H35371 Puckering of macula, right eye: Secondary | ICD-10-CM | POA: Diagnosis not present

## 2018-05-14 DIAGNOSIS — H5789 Other specified disorders of eye and adnexa: Secondary | ICD-10-CM | POA: Diagnosis not present

## 2018-06-24 DIAGNOSIS — M9903 Segmental and somatic dysfunction of lumbar region: Secondary | ICD-10-CM | POA: Diagnosis not present

## 2018-06-24 DIAGNOSIS — M545 Low back pain: Secondary | ICD-10-CM | POA: Diagnosis not present

## 2018-06-24 DIAGNOSIS — M9905 Segmental and somatic dysfunction of pelvic region: Secondary | ICD-10-CM | POA: Diagnosis not present

## 2018-07-01 DIAGNOSIS — Z01419 Encounter for gynecological examination (general) (routine) without abnormal findings: Secondary | ICD-10-CM | POA: Diagnosis not present

## 2018-07-01 DIAGNOSIS — Z124 Encounter for screening for malignant neoplasm of cervix: Secondary | ICD-10-CM | POA: Diagnosis not present

## 2018-07-04 DIAGNOSIS — H35371 Puckering of macula, right eye: Secondary | ICD-10-CM | POA: Diagnosis not present

## 2018-08-20 DIAGNOSIS — M9903 Segmental and somatic dysfunction of lumbar region: Secondary | ICD-10-CM | POA: Diagnosis not present

## 2018-08-20 DIAGNOSIS — M9905 Segmental and somatic dysfunction of pelvic region: Secondary | ICD-10-CM | POA: Diagnosis not present

## 2018-08-20 DIAGNOSIS — M545 Low back pain: Secondary | ICD-10-CM | POA: Diagnosis not present

## 2018-08-22 DIAGNOSIS — M545 Low back pain: Secondary | ICD-10-CM | POA: Diagnosis not present

## 2018-08-22 DIAGNOSIS — M9903 Segmental and somatic dysfunction of lumbar region: Secondary | ICD-10-CM | POA: Diagnosis not present

## 2018-08-22 DIAGNOSIS — M9905 Segmental and somatic dysfunction of pelvic region: Secondary | ICD-10-CM | POA: Diagnosis not present

## 2018-08-30 DIAGNOSIS — H25041 Posterior subcapsular polar age-related cataract, right eye: Secondary | ICD-10-CM | POA: Diagnosis not present

## 2018-09-12 DIAGNOSIS — M9903 Segmental and somatic dysfunction of lumbar region: Secondary | ICD-10-CM | POA: Diagnosis not present

## 2018-09-12 DIAGNOSIS — M545 Low back pain: Secondary | ICD-10-CM | POA: Diagnosis not present

## 2018-09-12 DIAGNOSIS — M9905 Segmental and somatic dysfunction of pelvic region: Secondary | ICD-10-CM | POA: Diagnosis not present

## 2018-09-13 DIAGNOSIS — H2511 Age-related nuclear cataract, right eye: Secondary | ICD-10-CM | POA: Diagnosis not present

## 2018-09-13 DIAGNOSIS — Z01818 Encounter for other preprocedural examination: Secondary | ICD-10-CM | POA: Diagnosis not present

## 2018-09-26 DIAGNOSIS — H25811 Combined forms of age-related cataract, right eye: Secondary | ICD-10-CM | POA: Diagnosis not present

## 2018-09-26 DIAGNOSIS — H2511 Age-related nuclear cataract, right eye: Secondary | ICD-10-CM | POA: Diagnosis not present

## 2018-09-26 DIAGNOSIS — H25041 Posterior subcapsular polar age-related cataract, right eye: Secondary | ICD-10-CM | POA: Diagnosis not present

## 2018-10-14 DIAGNOSIS — I10 Essential (primary) hypertension: Secondary | ICD-10-CM | POA: Diagnosis not present

## 2018-10-14 DIAGNOSIS — R7309 Other abnormal glucose: Secondary | ICD-10-CM | POA: Diagnosis not present

## 2018-10-14 DIAGNOSIS — E785 Hyperlipidemia, unspecified: Secondary | ICD-10-CM | POA: Diagnosis not present

## 2018-10-14 DIAGNOSIS — Z79899 Other long term (current) drug therapy: Secondary | ICD-10-CM | POA: Diagnosis not present

## 2018-10-14 DIAGNOSIS — J45909 Unspecified asthma, uncomplicated: Secondary | ICD-10-CM | POA: Diagnosis not present

## 2018-10-22 DIAGNOSIS — Z961 Presence of intraocular lens: Secondary | ICD-10-CM | POA: Diagnosis not present

## 2018-11-22 DIAGNOSIS — Z23 Encounter for immunization: Secondary | ICD-10-CM | POA: Diagnosis not present

## 2018-11-28 DIAGNOSIS — M9905 Segmental and somatic dysfunction of pelvic region: Secondary | ICD-10-CM | POA: Diagnosis not present

## 2018-11-28 DIAGNOSIS — M545 Low back pain: Secondary | ICD-10-CM | POA: Diagnosis not present

## 2018-11-28 DIAGNOSIS — M9903 Segmental and somatic dysfunction of lumbar region: Secondary | ICD-10-CM | POA: Diagnosis not present

## 2019-01-29 DIAGNOSIS — M9905 Segmental and somatic dysfunction of pelvic region: Secondary | ICD-10-CM | POA: Diagnosis not present

## 2019-01-29 DIAGNOSIS — M545 Low back pain: Secondary | ICD-10-CM | POA: Diagnosis not present

## 2019-01-29 DIAGNOSIS — M9903 Segmental and somatic dysfunction of lumbar region: Secondary | ICD-10-CM | POA: Diagnosis not present

## 2019-02-13 DIAGNOSIS — Z79899 Other long term (current) drug therapy: Secondary | ICD-10-CM | POA: Diagnosis not present

## 2019-02-13 DIAGNOSIS — R7301 Impaired fasting glucose: Secondary | ICD-10-CM | POA: Diagnosis not present

## 2019-02-19 DIAGNOSIS — Z6833 Body mass index (BMI) 33.0-33.9, adult: Secondary | ICD-10-CM | POA: Diagnosis not present

## 2019-02-19 DIAGNOSIS — R05 Cough: Secondary | ICD-10-CM | POA: Diagnosis not present

## 2019-03-18 DIAGNOSIS — M9905 Segmental and somatic dysfunction of pelvic region: Secondary | ICD-10-CM | POA: Diagnosis not present

## 2019-03-18 DIAGNOSIS — M9903 Segmental and somatic dysfunction of lumbar region: Secondary | ICD-10-CM | POA: Diagnosis not present

## 2019-03-18 DIAGNOSIS — M545 Low back pain: Secondary | ICD-10-CM | POA: Diagnosis not present

## 2019-03-27 DIAGNOSIS — M9903 Segmental and somatic dysfunction of lumbar region: Secondary | ICD-10-CM | POA: Diagnosis not present

## 2019-03-27 DIAGNOSIS — M9905 Segmental and somatic dysfunction of pelvic region: Secondary | ICD-10-CM | POA: Diagnosis not present

## 2019-03-27 DIAGNOSIS — M545 Low back pain: Secondary | ICD-10-CM | POA: Diagnosis not present

## 2019-04-10 DIAGNOSIS — M9905 Segmental and somatic dysfunction of pelvic region: Secondary | ICD-10-CM | POA: Diagnosis not present

## 2019-04-10 DIAGNOSIS — M9903 Segmental and somatic dysfunction of lumbar region: Secondary | ICD-10-CM | POA: Diagnosis not present

## 2019-04-10 DIAGNOSIS — M545 Low back pain: Secondary | ICD-10-CM | POA: Diagnosis not present

## 2019-04-14 DIAGNOSIS — J45909 Unspecified asthma, uncomplicated: Secondary | ICD-10-CM | POA: Diagnosis not present

## 2019-04-14 DIAGNOSIS — I10 Essential (primary) hypertension: Secondary | ICD-10-CM | POA: Diagnosis not present

## 2019-04-14 DIAGNOSIS — E785 Hyperlipidemia, unspecified: Secondary | ICD-10-CM | POA: Diagnosis not present

## 2020-04-16 DIAGNOSIS — R079 Chest pain, unspecified: Secondary | ICD-10-CM

## 2020-04-16 DIAGNOSIS — G459 Transient cerebral ischemic attack, unspecified: Secondary | ICD-10-CM

## 2020-04-16 DIAGNOSIS — D6861 Antiphospholipid syndrome: Secondary | ICD-10-CM

## 2020-04-17 DIAGNOSIS — D6861 Antiphospholipid syndrome: Secondary | ICD-10-CM | POA: Diagnosis not present

## 2020-04-17 DIAGNOSIS — G459 Transient cerebral ischemic attack, unspecified: Secondary | ICD-10-CM | POA: Diagnosis not present

## 2020-04-17 DIAGNOSIS — R079 Chest pain, unspecified: Secondary | ICD-10-CM | POA: Diagnosis not present

## 2021-09-15 ENCOUNTER — Encounter: Payer: Self-pay | Admitting: Gastroenterology

## 2023-01-17 ENCOUNTER — Encounter: Payer: Self-pay | Admitting: *Deleted

## 2023-01-17 ENCOUNTER — Encounter: Payer: Self-pay | Admitting: Cardiology

## 2023-01-22 DIAGNOSIS — I10 Essential (primary) hypertension: Secondary | ICD-10-CM | POA: Insufficient documentation

## 2023-01-22 DIAGNOSIS — E7800 Pure hypercholesterolemia, unspecified: Secondary | ICD-10-CM | POA: Insufficient documentation

## 2023-01-22 DIAGNOSIS — M1990 Unspecified osteoarthritis, unspecified site: Secondary | ICD-10-CM | POA: Insufficient documentation

## 2023-01-22 DIAGNOSIS — M5030 Other cervical disc degeneration, unspecified cervical region: Secondary | ICD-10-CM | POA: Insufficient documentation

## 2023-01-22 DIAGNOSIS — J45909 Unspecified asthma, uncomplicated: Secondary | ICD-10-CM | POA: Insufficient documentation

## 2023-01-23 NOTE — Progress Notes (Unsigned)
Cardiology Office Note:    Date:  01/24/2023   ID:  Tina Lester, DOB 05-24-1961, MRN [id number]  PCP:  Ernestene Kiel, MD  Cardiologist:  Shirlee More, MD   Referring MD: Ernestene Kiel, MD  ASSESSMENT:    1. Bradycardia   2. Primary hypertension   3. High cholesterol    PLAN:    In order of problems listed above:  Clinically she had typical faint vasovagal stimulus emotional input and she just happened to capture with a smart watch with bradycardia present sinus.  She has had no other episodes she is continuously monitored and I do not think she needs further testing like echocardiogram ischemia evaluation or extended ambulatory monitors.  I asked her to simply activate the EKG capture and transmission so she can send strips to me in the future set her lower heart rate limit of 40 upper rate of 144 alerts and continue her life with self monitoring.  I did ask her to tell physicians in the future to be careful if any medications given to her for blood pressure for things like glaucoma could influence her resting heart rate Controlled continue her current treatment ACE inhibitor Continue her statin with a history of TIA Is a final note I told her this is a Medicare quality initiative to avoid high-dose aspirin because of bruising and bleeding and she will discuss with her PCP if she can drop to the COVID 81 mg/day  Next appointment will sign up with my chart she can send strips to me and I will plan to see her back in the office as needed   Medication Adjustments/Labs and Tests Ordered: Current medicines are reviewed at length with the patient today.  Concerns regarding medicines are outlined above.  Orders Placed This Encounter  Procedures   EKG 12-Lead   No orders of the defined types were placed in this encounter.    Chief Complaint  Patient presents with   Bradycardia    She had an episode of near syncope in the context of emotional input associated  bradycardia and a heart rate of 41 on her smart watch    History of Present Illness:    Tina Lester is a 62 y.o. female with a history of hypertension and hyperlipidemia who is being seen today for the evaluation of bradycardia at the request of Ernestene Kiel, MD.  She had an episode with emotional stress where she felt lightheaded and a smart watch recorded heart rate of 41 bpm in general heart rates tend to be in the 50s.  She did not lose consciousness.  Recent labs showed normal TSH 1.34 and T47.3 Magnesium one 2.1 BMP was normal potassium 4.4 creatinine 0.77 GFR 89 cc/min EKG report describes sinus rhythm 82 bpm poor R wave progression.  She had an episode best described as near syncope and she fell to ground but did not lose consciousness and the trigger was a profound emotional event Wearing a smart watch and her heart rate was 41 bpm at times she did not capture EKG She has good healthcare literacy tracks her heart rate at rest and tends to run in the range of 60 or greater and with physical effort in the 120s she exercises on a regular basis She had a TIA in the past tells me that she has lupus anticoagulant and she takes aspirin evaluated by hematology She is no history of heart disease congenital rheumatic or atrial fibrillation She has had no chest pain shortness  of breath palpitation or syncope She takes no rate slowing medications He has no history of a seizure disorder Past Medical History:  Diagnosis Date   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   aspirin 325 MG tablet Take 325 mg by mouth daily.   famotidine (PEPCID) 40 MG tablet Take 1 tablet by mouth daily as needed for heartburn or indigestion.   fluticasone (FLONASE) 50 MCG/ACT nasal spray Place 1 spray into both nostrils daily as needed for allergies or rhinitis.   loratadine (CLARITIN) 10 MG tablet Take 10 mg by mouth daily as needed for allergies.   montelukast (SINGULAIR) 10 MG tablet Take 10 mg by mouth at bedtime.   rosuvastatin (CRESTOR) 10 MG tablet Take 10 mg by mouth daily.     Allergies:   Acetaminophen-codeine   Social History   Socioeconomic History   Marital status: Married    Spouse name: Not on file   Number of children: 2   Years of education: 14   Highest education level: Not on file  Occupational History   Occupation: Management  Tobacco Use   Smoking status: Never   Smokeless tobacco: Never  Substance and Sexual Activity   Alcohol use: Yes    Alcohol/week: 0.0 standard drinks of alcohol    Comment: 1-2 drinks per month   Drug use: No   Sexual activity: Not on file  Other Topics Concern   Not on file  Social History Narrative   Lives at home with husband and son.   1-2 cups caffeine per day.   Right-handed.   Social Determinants of Health   Financial Resource Strain: Not on file  Food Insecurity: Not on file  Transportation Needs: Not on file  Physical Activity: Not on file  Stress: Not on file  Social Connections: Not on file     Family History: The patient's family history includes Alcoholism in her father; Breast cancer in her paternal aunt; Dementia in her father; Diabetes in her mother; Hypertension in her mother; Stroke in her father; Tuberculosis in her father.  ROS:   ROS Please see the history of present illness.     All other systems reviewed and are  negative.  EKGs/Labs/Other Studies Reviewed:    The following studies were reviewed today:   EKG:  EKG is  ordered today.  The ekg ordered today is personally reviewed and demonstrates sinus rhythm poor R wave progression normal EKG  Recent Labs: 08/22/2022 cholesterol 192 LDL 90 high intensity statin hemoglobin A1c 5.4 hemoglobin 15.1 creatinine 0.92  Physical Exam:    VS:  BP (!) 140/98 (BP Location: Right Arm, Patient Position: Sitting)   Pulse 86   Ht 5' 2"$  (1.575 m)   Wt 193 lb 3.2 oz (87.6 kg)   SpO2 96%   BMI 35.34  kg/m     Wt Readings from Last 3 Encounters:  01/24/23 193 lb 3.2 oz (87.6 kg)  01/04/23 194 lb (88 kg)  02/08/17 204 lb (92.5 kg)     GEN:  Well nourished, well developed in no acute distress HEENT: Normal NECK: No JVD; No carotid bruits LYMPHATICS: No lymphadenopathy CARDIAC: RRR, no murmurs, rubs, gallops RESPIRATORY:  Clear to auscultation without rales, wheezing or rhonchi  ABDOMEN: Soft, non-tender, non-distended MUSCULOSKELETAL:  No edema; No deformity  SKIN: Warm and dry NEUROLOGIC:  Alert and oriented x 3 PSYCHIATRIC:  Normal affect   Seen with Jacobo Forest RN chaperone    Signed, Shirlee More, MD  01/24/2023 9:22 AM    [place]

## 2023-01-24 ENCOUNTER — Ambulatory Visit: Payer: 59 | Attending: Cardiology | Admitting: Cardiology

## 2023-01-24 ENCOUNTER — Encounter: Payer: Self-pay | Admitting: Cardiology

## 2023-01-24 VITALS — BP 136/88 | HR 86 | Ht 62.0 in | Wt 193.2 lb

## 2023-01-24 DIAGNOSIS — I10 Essential (primary) hypertension: Secondary | ICD-10-CM | POA: Diagnosis not present

## 2023-01-24 DIAGNOSIS — E7800 Pure hypercholesterolemia, unspecified: Secondary | ICD-10-CM | POA: Diagnosis not present

## 2023-01-24 DIAGNOSIS — R001 Bradycardia, unspecified: Secondary | ICD-10-CM | POA: Diagnosis not present

## 2023-01-24 NOTE — Patient Instructions (Addendum)
Medication Instructions:  Your physician recommends that you continue on your current medications as directed. Please refer to the Current Medication list given to you today.  *If you need a refill on your cardiac medications before your next appointment, please call your pharmacy*   Lab Work: None If you have labs (blood work) drawn today and your tests are completely normal, you will receive your results only by: [place] (if you have MyChart) OR A paper copy in the mail If you have any lab test that is abnormal or we need to change your treatment, we will call you to review the results.   Testing/Procedures: None   Follow-Up: At [facility], you and your health needs are our priority.  As part of our continuing mission to provide you with exceptional heart care, we have created designated Provider Care Teams.  These Care Teams include your primary Cardiologist (physician) and Advanced Practice Providers (APPs -  Physician Assistants and Nurse Practitioners) who all work together to provide you with the care you need, when you need it.  We recommend signing up for the patient portal called "MyChart".  Sign up information is provided on this After Visit Summary.  MyChart is used to connect with patients for Virtual Visits (Telemedicine).  Patients are able to view lab/test results, encounter notes, upcoming appointments, etc.  Non-urgent messages can be sent to your provider as well.   To learn more about what you can do with MyChart, go to [email or web address].    Your next appointment:   Follow up as needed  Provider:   Shirlee More, MD    Other Instructions Set - up heart rate limits at 40 - 140 top. Apple watch and set-up.  Accompanied physician for entire patient appointment.  Suann Larry. Kenton Kingfisher, RN

## 2023-06-21 DIAGNOSIS — M62542 Muscle wasting and atrophy, not elsewhere classified, left hand: Secondary | ICD-10-CM | POA: Diagnosis not present

## 2023-06-21 DIAGNOSIS — M62541 Muscle wasting and atrophy, not elsewhere classified, right hand: Secondary | ICD-10-CM | POA: Diagnosis not present

## 2023-06-21 DIAGNOSIS — M79642 Pain in left hand: Secondary | ICD-10-CM | POA: Diagnosis not present

## 2023-06-21 DIAGNOSIS — M79641 Pain in right hand: Secondary | ICD-10-CM | POA: Diagnosis not present

## 2023-07-02 DIAGNOSIS — M62541 Muscle wasting and atrophy, not elsewhere classified, right hand: Secondary | ICD-10-CM | POA: Diagnosis not present

## 2023-07-02 DIAGNOSIS — M79641 Pain in right hand: Secondary | ICD-10-CM | POA: Diagnosis not present

## 2023-07-02 DIAGNOSIS — M62542 Muscle wasting and atrophy, not elsewhere classified, left hand: Secondary | ICD-10-CM | POA: Diagnosis not present

## 2023-07-02 DIAGNOSIS — M79642 Pain in left hand: Secondary | ICD-10-CM | POA: Diagnosis not present

## 2023-08-30 DIAGNOSIS — J4520 Mild intermittent asthma, uncomplicated: Secondary | ICD-10-CM | POA: Diagnosis not present

## 2023-08-30 DIAGNOSIS — Z79899 Other long term (current) drug therapy: Secondary | ICD-10-CM | POA: Diagnosis not present

## 2023-08-30 DIAGNOSIS — E785 Hyperlipidemia, unspecified: Secondary | ICD-10-CM | POA: Diagnosis not present

## 2023-12-20 DIAGNOSIS — J019 Acute sinusitis, unspecified: Secondary | ICD-10-CM | POA: Diagnosis not present

## 2023-12-20 DIAGNOSIS — Z6834 Body mass index (BMI) 34.0-34.9, adult: Secondary | ICD-10-CM | POA: Diagnosis not present

## 2024-02-21 DIAGNOSIS — R051 Acute cough: Secondary | ICD-10-CM | POA: Diagnosis not present

## 2024-02-21 DIAGNOSIS — J45901 Unspecified asthma with (acute) exacerbation: Secondary | ICD-10-CM | POA: Diagnosis not present

## 2024-02-21 DIAGNOSIS — H60502 Unspecified acute noninfective otitis externa, left ear: Secondary | ICD-10-CM | POA: Diagnosis not present

## 2024-02-21 DIAGNOSIS — J101 Influenza due to other identified influenza virus with other respiratory manifestations: Secondary | ICD-10-CM | POA: Diagnosis not present

## 2024-02-27 DIAGNOSIS — E785 Hyperlipidemia, unspecified: Secondary | ICD-10-CM | POA: Diagnosis not present

## 2024-02-27 DIAGNOSIS — J4520 Mild intermittent asthma, uncomplicated: Secondary | ICD-10-CM | POA: Diagnosis not present

## 2024-02-27 DIAGNOSIS — Z6834 Body mass index (BMI) 34.0-34.9, adult: Secondary | ICD-10-CM | POA: Diagnosis not present

## 2024-02-27 DIAGNOSIS — Z79899 Other long term (current) drug therapy: Secondary | ICD-10-CM | POA: Diagnosis not present

## 2024-04-15 DIAGNOSIS — R059 Cough, unspecified: Secondary | ICD-10-CM | POA: Diagnosis not present

## 2024-04-16 DIAGNOSIS — J4520 Mild intermittent asthma, uncomplicated: Secondary | ICD-10-CM | POA: Diagnosis not present

## 2024-04-16 DIAGNOSIS — R051 Acute cough: Secondary | ICD-10-CM | POA: Diagnosis not present

## 2024-04-16 DIAGNOSIS — Z6834 Body mass index (BMI) 34.0-34.9, adult: Secondary | ICD-10-CM | POA: Diagnosis not present

## 2024-04-16 DIAGNOSIS — J40 Bronchitis, not specified as acute or chronic: Secondary | ICD-10-CM | POA: Diagnosis not present
